# Patient Record
Sex: Male | Born: 1965 | Race: Black or African American | Hispanic: No | Marital: Single | Smoking: Never smoker
Health system: Southern US, Community
[De-identification: ages and names within clinical notes are randomized; demographics above are authoritative.]

## PROBLEM LIST (undated history)

## (undated) DIAGNOSIS — F329 Major depressive disorder, single episode, unspecified: Secondary | ICD-10-CM

## (undated) DIAGNOSIS — I10 Essential (primary) hypertension: Secondary | ICD-10-CM

## (undated) DIAGNOSIS — N390 Urinary tract infection, site not specified: Secondary | ICD-10-CM

## (undated) DIAGNOSIS — F419 Anxiety disorder, unspecified: Secondary | ICD-10-CM

## (undated) DIAGNOSIS — G8929 Other chronic pain: Secondary | ICD-10-CM

## (undated) DIAGNOSIS — M549 Dorsalgia, unspecified: Secondary | ICD-10-CM

## (undated) DIAGNOSIS — F32A Depression, unspecified: Secondary | ICD-10-CM

---

## 1999-10-04 ENCOUNTER — Emergency Department (HOSPITAL_COMMUNITY): Admission: EM | Admit: 1999-10-04 | Discharge: 1999-10-05 | Payer: Self-pay | Admitting: Emergency Medicine

## 1999-10-05 ENCOUNTER — Encounter: Payer: Self-pay | Admitting: Emergency Medicine

## 2001-02-12 ENCOUNTER — Encounter: Admission: RE | Admit: 2001-02-12 | Discharge: 2001-02-12 | Payer: Self-pay | Admitting: General Practice

## 2001-02-12 ENCOUNTER — Encounter: Payer: Self-pay | Admitting: General Practice

## 2001-05-02 ENCOUNTER — Ambulatory Visit (HOSPITAL_COMMUNITY): Admission: RE | Admit: 2001-05-02 | Discharge: 2001-05-02 | Payer: Self-pay | Admitting: Orthopedic Surgery

## 2001-08-11 ENCOUNTER — Encounter: Payer: Self-pay | Admitting: Physical Medicine and Rehabilitation

## 2001-08-11 ENCOUNTER — Ambulatory Visit (HOSPITAL_COMMUNITY)
Admission: RE | Admit: 2001-08-11 | Discharge: 2001-08-11 | Payer: Self-pay | Admitting: Physical Medicine and Rehabilitation

## 2004-07-08 ENCOUNTER — Emergency Department (HOSPITAL_COMMUNITY): Admission: EM | Admit: 2004-07-08 | Discharge: 2004-07-08 | Payer: Self-pay | Admitting: Emergency Medicine

## 2004-11-15 ENCOUNTER — Emergency Department (HOSPITAL_COMMUNITY): Admission: EM | Admit: 2004-11-15 | Discharge: 2004-11-15 | Payer: Self-pay | Admitting: Emergency Medicine

## 2004-11-28 ENCOUNTER — Ambulatory Visit: Payer: Self-pay | Admitting: Gastroenterology

## 2004-12-06 ENCOUNTER — Encounter (INDEPENDENT_AMBULATORY_CARE_PROVIDER_SITE_OTHER): Payer: Self-pay | Admitting: *Deleted

## 2004-12-06 ENCOUNTER — Ambulatory Visit: Payer: Self-pay | Admitting: Gastroenterology

## 2005-01-09 ENCOUNTER — Ambulatory Visit: Payer: Self-pay | Admitting: Gastroenterology

## 2005-06-25 ENCOUNTER — Emergency Department (HOSPITAL_COMMUNITY): Admission: EM | Admit: 2005-06-25 | Discharge: 2005-06-25 | Payer: Self-pay | Admitting: *Deleted

## 2006-11-09 ENCOUNTER — Emergency Department (HOSPITAL_COMMUNITY): Admission: EM | Admit: 2006-11-09 | Discharge: 2006-11-09 | Payer: Self-pay | Admitting: Emergency Medicine

## 2007-04-19 ENCOUNTER — Emergency Department (HOSPITAL_COMMUNITY): Admission: EM | Admit: 2007-04-19 | Discharge: 2007-04-19 | Payer: Self-pay | Admitting: Emergency Medicine

## 2009-06-15 ENCOUNTER — Telehealth (INDEPENDENT_AMBULATORY_CARE_PROVIDER_SITE_OTHER): Payer: Self-pay | Admitting: *Deleted

## 2009-10-06 ENCOUNTER — Ambulatory Visit: Payer: Self-pay | Admitting: Internal Medicine

## 2009-10-06 ENCOUNTER — Encounter (INDEPENDENT_AMBULATORY_CARE_PROVIDER_SITE_OTHER): Payer: Self-pay | Admitting: *Deleted

## 2009-10-06 LAB — CONVERTED CEMR LAB
Alkaline Phosphatase: 63 units/L (ref 39–117)
BUN: 11 mg/dL (ref 6–23)
Cholesterol: 228 mg/dL — ABNORMAL HIGH (ref 0–200)
Creatinine, Ser: 1.41 mg/dL (ref 0.40–1.50)
Glucose, Bld: 131 mg/dL — ABNORMAL HIGH (ref 70–99)
HDL: 53 mg/dL (ref >39)
LDL Cholesterol: 148 mg/dL — ABNORMAL HIGH (ref 0–99)
Sodium: 142 meq/L (ref 135–145)
Total Bilirubin: 0.5 mg/dL (ref 0.3–1.2)
Total CHOL/HDL Ratio: 4.3
Triglycerides: 135 mg/dL (ref <150)
VLDL: 27 mg/dL (ref 0–40)

## 2009-12-01 ENCOUNTER — Encounter (INDEPENDENT_AMBULATORY_CARE_PROVIDER_SITE_OTHER): Payer: Self-pay | Admitting: Internal Medicine

## 2009-12-01 LAB — CONVERTED CEMR LAB
Basophils Absolute: 0 10*3/uL (ref 0.0–0.1)
Basophils Relative: 0 % (ref 0–1)
Lymphocytes Relative: 39 % (ref 12–46)
MCHC: 33.6 g/dL (ref 30.0–36.0)
Neutro Abs: 3.1 10*3/uL (ref 1.7–7.7)
Platelets: 323 10*3/uL (ref 150–400)
RDW: 12 % (ref 11.5–15.5)

## 2009-12-13 ENCOUNTER — Encounter
Admission: RE | Admit: 2009-12-13 | Discharge: 2009-12-28 | Payer: Self-pay | Source: Home / Self Care | Attending: Family Medicine | Admitting: Family Medicine

## 2010-01-01 ENCOUNTER — Encounter: Admission: RE | Admit: 2010-01-01 | Payer: Self-pay | Source: Home / Self Care | Admitting: Family Medicine

## 2010-01-03 ENCOUNTER — Encounter
Admission: RE | Admit: 2010-01-03 | Discharge: 2010-01-31 | Payer: Self-pay | Source: Home / Self Care | Attending: Family Medicine | Admitting: Family Medicine

## 2010-01-22 ENCOUNTER — Encounter: Payer: Self-pay | Admitting: Neurosurgery

## 2010-02-02 NOTE — Progress Notes (Signed)
  Phone Note Other Incoming   Request: Send information Summary of Call: Request for records received from [facility]. Request forwarded to [facility].     

## 2010-09-20 ENCOUNTER — Encounter: Payer: Self-pay | Admitting: Family Medicine

## 2010-09-20 ENCOUNTER — Emergency Department (HOSPITAL_BASED_OUTPATIENT_CLINIC_OR_DEPARTMENT_OTHER)
Admission: EM | Admit: 2010-09-20 | Discharge: 2010-09-20 | Disposition: A | Discharge status: Home / Self Care | Payer: Self-pay | Attending: Emergency Medicine | Admitting: Emergency Medicine

## 2010-09-20 DIAGNOSIS — F341 Dysthymic disorder: Secondary | ICD-10-CM | POA: Insufficient documentation

## 2010-09-20 DIAGNOSIS — M549 Dorsalgia, unspecified: Secondary | ICD-10-CM | POA: Insufficient documentation

## 2010-09-20 DIAGNOSIS — G8929 Other chronic pain: Secondary | ICD-10-CM | POA: Insufficient documentation

## 2010-09-20 DIAGNOSIS — I10 Essential (primary) hypertension: Secondary | ICD-10-CM | POA: Insufficient documentation

## 2010-09-20 HISTORY — DX: Anxiety disorder, unspecified: F41.9

## 2010-09-20 HISTORY — DX: Major depressive disorder, single episode, unspecified: F32.9

## 2010-09-20 HISTORY — DX: Other chronic pain: G89.29

## 2010-09-20 HISTORY — DX: Dorsalgia, unspecified: M54.9

## 2010-09-20 HISTORY — DX: Depression, unspecified: F32.A

## 2010-09-20 HISTORY — DX: Essential (primary) hypertension: I10

## 2010-09-20 MED ORDER — HYDROCODONE-ACETAMINOPHEN 5-500 MG PO TABS: 1.0000 | ORAL_TABLET | Freq: Four times a day (QID) | ORAL | Status: AC | PRN
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
## 2010-09-24 NOTE — ED Provider Notes (Signed)
Medical screening examination/treatment/procedure(s) were performed by non-physician practitioner and as supervising physician I was immediately available for consultation/collaboration.  Toy Baker, MD 09/24/10 [phone]

## 2010-09-26 LAB — POCT CARDIAC MARKERS
Myoglobin, poc: 120
Operator id: 2725
Troponin i, poc: <0.05

## 2010-09-26 LAB — CBC
HCT: 43.8
MCV: 89.5
Platelets: 349
RBC: 4.89
WBC: 11 — ABNORMAL HIGH

## 2010-09-26 LAB — BASIC METABOLIC PANEL
BUN: 12
Chloride: 100
Creatinine, Ser: 1.6 — ABNORMAL HIGH
GFR calc Af Amer: 57 — ABNORMAL LOW
GFR calc non Af Amer: 47 — ABNORMAL LOW
Potassium: 4

## 2010-09-26 LAB — DIFFERENTIAL
Eosinophils Absolute: 0
Lymphocytes Relative: 14
Lymphs Abs: 1.5
Monocytes Relative: 3
Neutrophils Relative %: 82 — ABNORMAL HIGH

## 2012-03-03 ENCOUNTER — Other Ambulatory Visit: Payer: Self-pay | Admitting: Neurosurgery

## 2012-03-03 DIAGNOSIS — M47817 Spondylosis without myelopathy or radiculopathy, lumbosacral region: Secondary | ICD-10-CM

## 2012-03-06 ENCOUNTER — Ambulatory Visit
Admission: RE | Admit: 2012-03-06 | Discharge: 2012-03-06 | Disposition: A | Discharge status: Home / Self Care | Payer: Self-pay | Source: Ambulatory Visit | Attending: Neurosurgery | Admitting: Neurosurgery

## 2012-03-06 DIAGNOSIS — M47817 Spondylosis without myelopathy or radiculopathy, lumbosacral region: Secondary | ICD-10-CM

## 2012-03-21 ENCOUNTER — Other Ambulatory Visit: Payer: Self-pay | Admitting: Neurosurgery

## 2012-03-21 DIAGNOSIS — M47816 Spondylosis without myelopathy or radiculopathy, lumbar region: Secondary | ICD-10-CM

## 2012-03-25 ENCOUNTER — Other Ambulatory Visit: Payer: Self-pay

## 2012-03-27 ENCOUNTER — Other Ambulatory Visit: Payer: Self-pay

## 2012-04-01 ENCOUNTER — Ambulatory Visit
Admission: RE | Admit: 2012-04-01 | Discharge: 2012-04-01 | Disposition: A | Discharge status: Home / Self Care | Payer: No Typology Code available for payment source | Source: Ambulatory Visit | Attending: Neurosurgery | Admitting: Neurosurgery

## 2012-04-01 DIAGNOSIS — M47816 Spondylosis without myelopathy or radiculopathy, lumbar region: Secondary | ICD-10-CM

## 2012-04-01 MED ORDER — METHYLPREDNISOLONE ACETATE 40 MG/ML INJ SUSP (RADIOLOG
120.0000 mg | Freq: Once | INTRAMUSCULAR | Status: AC
  Administered 2012-04-01: 120 mg via INTRA_ARTICULAR

## 2012-04-01 MED ORDER — IOHEXOL 180 MG/ML  SOLN
2.0000 mL | Freq: Once | INTRAMUSCULAR | Status: AC | PRN
  Administered 2012-04-01: 2 mL via INTRA_ARTICULAR

## 2013-08-19 ENCOUNTER — Encounter (HOSPITAL_BASED_OUTPATIENT_CLINIC_OR_DEPARTMENT_OTHER): Payer: Self-pay | Admitting: Emergency Medicine

## 2013-08-19 ENCOUNTER — Emergency Department (HOSPITAL_BASED_OUTPATIENT_CLINIC_OR_DEPARTMENT_OTHER)
Admission: EM | Admit: 2013-08-19 | Discharge: 2013-08-19 | Disposition: A | Discharge status: Home / Self Care | Payer: Medicare Other | Attending: Emergency Medicine | Admitting: Emergency Medicine

## 2013-08-19 ENCOUNTER — Emergency Department (HOSPITAL_BASED_OUTPATIENT_CLINIC_OR_DEPARTMENT_OTHER): Payer: Medicare Other

## 2013-08-19 DIAGNOSIS — N139 Obstructive and reflux uropathy, unspecified: Secondary | ICD-10-CM | POA: Insufficient documentation

## 2013-08-19 DIAGNOSIS — F411 Generalized anxiety disorder: Secondary | ICD-10-CM | POA: Diagnosis not present

## 2013-08-19 DIAGNOSIS — N400 Enlarged prostate without lower urinary tract symptoms: Secondary | ICD-10-CM | POA: Diagnosis not present

## 2013-08-19 DIAGNOSIS — Z87440 Personal history of urinary (tract) infections: Secondary | ICD-10-CM | POA: Diagnosis not present

## 2013-08-19 DIAGNOSIS — I10 Essential (primary) hypertension: Secondary | ICD-10-CM | POA: Diagnosis not present

## 2013-08-19 DIAGNOSIS — Z79899 Other long term (current) drug therapy: Secondary | ICD-10-CM | POA: Diagnosis not present

## 2013-08-19 DIAGNOSIS — G8929 Other chronic pain: Secondary | ICD-10-CM | POA: Insufficient documentation

## 2013-08-19 DIAGNOSIS — N200 Calculus of kidney: Secondary | ICD-10-CM | POA: Diagnosis not present

## 2013-08-19 DIAGNOSIS — F3289 Other specified depressive episodes: Secondary | ICD-10-CM | POA: Insufficient documentation

## 2013-08-19 DIAGNOSIS — Z791 Long term (current) use of non-steroidal anti-inflammatories (NSAID): Secondary | ICD-10-CM | POA: Insufficient documentation

## 2013-08-19 DIAGNOSIS — F329 Major depressive disorder, single episode, unspecified: Secondary | ICD-10-CM | POA: Insufficient documentation

## 2013-08-19 DIAGNOSIS — R339 Retention of urine, unspecified: Secondary | ICD-10-CM | POA: Insufficient documentation

## 2013-08-19 DIAGNOSIS — R338 Other retention of urine: Secondary | ICD-10-CM

## 2013-08-19 HISTORY — DX: Urinary tract infection, site not specified: N39.0

## 2013-08-19 LAB — URINE MICROSCOPIC-ADD ON

## 2013-08-19 LAB — URINALYSIS, ROUTINE W REFLEX MICROSCOPIC
BILIRUBIN URINE: NEGATIVE
Glucose, UA: NEGATIVE mg/dL
Ketones, ur: NEGATIVE mg/dL
Leukocytes, UA: NEGATIVE
Nitrite: NEGATIVE
PH: 5.5 (ref 5.0–8.0)
Protein, ur: NEGATIVE mg/dL
SPECIFIC GRAVITY, URINE: 1.022 (ref 1.005–1.030)
Urobilinogen, UA: 0.2 mg/dL (ref 0.0–1.0)

## 2013-08-19 MED ORDER — TAMSULOSIN HCL 0.4 MG PO CAPS: 0.4000 mg | ORAL_CAPSULE | Freq: Every day | ORAL | Status: DC

## 2013-08-19 MED ORDER — HYDROCODONE-ACETAMINOPHEN 5-325 MG PO TABS: 1.0000 | ORAL_TABLET | ORAL | Status: DC | PRN

## 2013-08-19 NOTE — Discharge Instructions (Signed)
You were evaluated for a repeat episode of "Acute Urinary Retention" Urine study showed no infection, but some microscopic blood consistent with possible Kidney Stone Rectal Exam was consistent with mildly enlarged smooth symmetric prostate, which may be possible "BPH or Enlarged Prostate" CT Abd/Pelvis scan showed "2 mm stone in the distal right ureter causing partial obstruction of the right collecting system. Urinary bladder wall is thickened, mildly enlarged prostate" This is most likely the cause of your pain.  Please call Dr. Vevelyn RoyalsBorden's Urology office, explain why you were seen in the ER and that you had a "Kidney stone with some partial obstruction, and acute urinary retention" they will schedule you a close follow-up appointment.  Also call your primary doctor to follow-up with these issues.  Prescribed Flomax take 1 tablet daily until you follow-up with Urology (please discuss this medicine with them, and they may have you continue or discontinue it) Take Norco as needed for pain.  If your symptoms get significantly worse, develop persistent urinary retention, abdominal pain, burning when you pee, blood in urine, fevers/chills, flank pain, please call your doctor for evaluation, otherwise return to Emergency Department.   Kidney Stones Kidney stones (urolithiasis) are deposits that form inside your kidneys. The intense pain is caused by the stone moving through the urinary tract. When the stone moves, the ureter goes into spasm around the stone. The stone is usually passed in the urine.  CAUSES   A disorder that makes certain neck glands produce too much parathyroid hormone (primary hyperparathyroidism).  A buildup of uric acid crystals, similar to gout in your joints.  Narrowing (stricture) of the ureter.  A kidney obstruction present at birth (congenital obstruction).  Previous surgery on the kidney or ureters.  Numerous kidney infections. SYMPTOMS   Feeling sick to your  stomach (nauseous).  Throwing up (vomiting).  Blood in the urine (hematuria).  Pain that usually spreads (radiates) to the groin.  Frequency or urgency of urination. DIAGNOSIS   Taking a history and physical exam.  Blood or urine tests.  CT scan.  Occasionally, an examination of the inside of the urinary bladder (cystoscopy) is performed. TREATMENT   Observation.  Increasing your fluid intake.  Extracorporeal shock wave lithotripsy--This is a noninvasive procedure that uses shock waves to break up kidney stones.  Surgery may be needed if you have severe pain or persistent obstruction. There are various surgical procedures. Most of the procedures are performed with the use of small instruments. Only small incisions are needed to accommodate these instruments, so recovery time is minimized. The size, location, and chemical composition are all important variables that will determine the proper choice of action for you. Talk to your health care provider to better understand your situation so that you will minimize the risk of injury to yourself and your kidney.  HOME CARE INSTRUCTIONS   Drink enough water and fluids to keep your urine clear or pale yellow. This will help you to pass the stone or stone fragments.  Strain all urine through the provided strainer. Keep all particulate matter and stones for your health care provider to see. The stone causing the pain may be as small as a grain of salt. It is very important to use the strainer each and every time you pass your urine. The collection of your stone will allow your health care provider to analyze it and verify that a stone has actually passed. The stone analysis will often identify what you can do to reduce the incidence  of recurrences.  Only take over-the-counter or prescription medicines for pain, discomfort, or fever as directed by your health care provider.  Make a follow-up appointment with your health care provider as  directed.  Get follow-up X-rays if required. The absence of pain does not always mean that the stone has passed. It may have only stopped moving. If the urine remains completely obstructed, it can cause loss of kidney function or even complete destruction of the kidney. It is your responsibility to make sure X-rays and follow-ups are completed. Ultrasounds of the kidney can show blockages and the status of the kidney. Ultrasounds are not associated with any radiation and can be performed easily in a matter of minutes. SEEK MEDICAL CARE IF:  You experience pain that is progressive and unresponsive to any pain medicine you have been prescribed. SEEK IMMEDIATE MEDICAL CARE IF:   Pain cannot be controlled with the prescribed medicine.  You have a fever or shaking chills.  The severity or intensity of pain increases over 18 hours and is not relieved by pain medicine.  You develop a new onset of abdominal pain.  You feel faint or pass out.  You are unable to urinate. MAKE SURE YOU:   Understand these instructions.  Will watch your condition.  Will get help right away if you are not doing well or get worse. Document Released: 12/18/2004 Document Revised: 08/20/2012 Document Reviewed: 05/21/2012 [facility] Patient Information 2015 [place], [place]. This information is not intended to replace advice given to you by your health care provider. Make sure you discuss any questions you have with your health care provider.     Acute Urinary Retention Acute urinary retention is when you are unable to pee (urinate). Acute urinary retention is common in older men. Prostates can get bigger, which blocks the flow of pee.  HOME CARE  Drink enough fluids to keep your pee clear or pale yellow.  If you are sent home with a tube that drains the bladder (catheter), there will be a drainage bag attached to it. There are two types of bags. One is big that you can wear at night without having to empty it. One  is smaller and needs to be emptied more often.  Keep the drainage bag empty.  Keep the drainage bag lower than your catheter.  Only take medicine as told by your doctor. GET HELP IF:  You have a low-grade fever.  You have spasms or you are leaking pee when you have spasms. GET HELP RIGHT AWAY IF:   You have chills or a fever.  Your catheter stops draining pee.  Your catheter falls out.  You have increased bleeding that does not stop after you have rested and increased the amount of fluids you had been drinking. MAKE SURE YOU:   Understand these instructions.  Will watch your condition.  Will get help right away if you are not doing well or get worse. Document Released: 06/06/2007 Document Revised: 10/08/2012 Document Reviewed: 05/29/2012 [facility] Patient Information 2015 [place], [place]. This information is not intended to replace advice given to you by your health care provider. Make sure you discuss any questions you have with your health care provider.

## 2013-08-19 NOTE — ED Provider Notes (Signed)
CSN: [id number]     Arrival date & time 08/19/13  4098 History   First MD Initiated Contact with Patient 08/19/13 0751   History provided by patient.   Chief Complaint  Patient presents with  . Urinary Retention   HPI  Patient complaints of urinary retention, started this morning around 0530. He describes feeling "bladder fullness, like he had to pee" but only able to urinate a very small amount, associated with sharp intermittent right inguinal and right low back pain (lasting about 1 min and occurred every few minutes), additional associated symptoms with some sweating, nausea (w/o vomiting), and episode of diarrhea. Denies any urinary retention, dysuria, or pain yesterday (was voiding normally). Significant recent history with similar episode about 1.5 months ago, went to Urgent Care stated he was "diagnosed with a urinary infection" and given Flomax (7 tablets), Vicodin, Phenergan (pt has empty pill bottles today), which resolved his urinary retention at that time, and he has been urinating normally for the past month. Additionally, denies fevers/chills, dysuria, hematuria, penile discharge, abdominal pain, new numbness / tingling, weakness, saddle anesthesia, urinary/fecal incontinence.  Significant PMH with chronic low back pain with Lumbar osteoarthritis, known L5-S1 foraminal stenosis and facet arthropathy, with multiple prior imaging studies, last MRI 2014. Additional history, patient denies hx kidney stones, states never sexually active, no hx STIs, no history of BPH.   Past Medical History  Diagnosis Date  . Hypertension   . Anxiety   . Depression   . Back pain, chronic   . UTI (lower urinary tract infection)    History reviewed. No pertinent past surgical history. No family history on file. History  Substance Use Topics  . Smoking status: Never Smoker   . Smokeless tobacco: Not on file  . Alcohol Use: No    Review of Systems  See above HPI  Allergies  Beeswax and  Benadryl  Home Medications   Prior to Admission medications   Medication Sig Start Date End Date Taking? Authorizing Provider  HYDROCODONE-ACETAMINOPHEN PO Take by mouth.   Yes Historical Provider, MD  cyclobenzaprine (FLEXERIL) 10 MG tablet Take 10 mg by mouth 3 (three) times daily as needed.      Historical Provider, MD  DULoxetine (CYMBALTA) 60 MG capsule Take 60 mg by mouth daily.      Historical Provider, MD  HYDROcodone-acetaminophen (NORCO) 5-325 MG per tablet Take 1-2 tablets by mouth every 4 (four) hours as needed. 08/19/13   Saralyn Pilar, DO  lisinopril-hydrochlorothiazide (PRINZIDE,ZESTORETIC) 20-25 MG per tablet Take 1 tablet by mouth daily.      Historical Provider, MD  meloxicam (MOBIC) 15 MG tablet Take 15 mg by mouth daily.      Historical Provider, MD  tamsulosin (FLOMAX) 0.4 MG CAPS capsule Take 1 capsule (0.4 mg total) by mouth daily. 08/19/13   Saralyn Pilar, DO  traMADol (ULTRAM) 50 MG tablet Take 50 mg by mouth every 6 (six) hours as needed.      Historical Provider, MD   BP 140/86  Pulse 75  Temp(Src) 97.9 F (36.6 C) (Oral)  Resp 16  SpO2 98% Physical Exam  Gen - well-appearing, pleasant, walks with cane, NAD HEENT - PERRL, EOMI, oropharynx clear, MMM Neck - supple Heart - RRR, no murmurs heard Abd - obese, soft, abdomen non-tender and non-distended, with +tenderness Right inguinal region, no masses, +active BS MSK - Back non-tender over spinous processes, Right lower lumbar / SI paraspinal muscle tenderness without hypertonicity Ext - non-tender, no edema, peripheral pulses  intact +2 b/l Skin - warm, dry, no rashes Neuro - awake, alert, oriented, grossly non-focal, intact muscle strength 5/5 b/l, intact distal sensation to light touch, gait normal GU - normal appearing male genitalia, no discharge or rash, testicles non-tender, no inguinal hernia Rectal - mild smooth symmetrically enlarged prostate without firmness or nodularity, no gross blood  on exam, no hemorrhoids  ED Course  Procedures (including critical care time) Labs Review Labs Reviewed  URINALYSIS, ROUTINE W REFLEX MICROSCOPIC - Abnormal; Notable for the following:    Hgb urine dipstick SMALL (*)    All other components within normal limits  URINE MICROSCOPIC-ADD ON - Abnormal; Notable for the following:    Bacteria, UA FEW (*)    All other components within normal limits  URINE CULTURE    Imaging Review Ct Abdomen Pelvis Wo Contrast  08/19/2013   CLINICAL DATA:  Urinary retention.  Right groin pain  EXAM: CT ABDOMEN AND PELVIS WITHOUT CONTRAST  TECHNIQUE: Multidetector CT imaging of the abdomen and pelvis was performed following the standard protocol without IV contrast.  COMPARISON:  None.  FINDINGS: Mild obstruction of the right kidney with mild right hydronephrosis and hydroureter. 2 mm stone in the distal right ureter at the UVJ causing partial obstruction of the right kidney. Left kidney shows no obstruction. No additional renal calculi. Bladder wall is thickened.  Lung bases are clear. Liver and gallbladder are normal. Pancreas and spleen are normal.  Negative for mass or adenopathy. The bowel appears normal. Normal appendix. Prostate mildly enlarged. No acute bony abnormality.  IMPRESSION: 2 mm stone in the distal right ureter causing partial obstruction of the right collecting system. Urinary bladder wall is thickened.   Electronically Signed   By: Marlan Palauharles  Clark M.D.   On: 08/19/2013 09:11     EKG Interpretation None      MDM   Final diagnoses:  Acute urinary retention  Prostate enlargement  Kidney stone on right side  Partial obstruction of the urinary tract   48 yr M with PMH hx chronic back pain, lumbar OA with L5-S1 foraminal stenosis without hx compression, presents for acute urinary retention since 0530, symptoms with fullness, sharp intermittent R-inguinal / LBP with minimal UOP today (yesterday urinating normal), current symptoms similar to  recent episode 1.5 months ago ([facility] resolution). No known hx BPH, nephrolithiasis. No red flags or new weakness, numbness/tingling, saddle anesthesia, urinary/fecal incontinence.  Proceed with attempted void, will collect UA, and perform post-void bladder scan to see if retaining. Anticipate patient may need in and out foley if acute retention, will offer rectal exam to eval prostate. Consider discharge on Flomax, and referral to Urology.  UPDATE @ 0855 - UA with neg nitrite, neg leuks, WBC 0-2 not suggestive of infection, significant with small Hgb and RBC 7-10, concerning for possible nephrolithiasis to explain symptoms. Rectal exam as above. Bladder Scan post-void residual with 40-54 cc remaining, consistent with appropriate voiding. Will order Abd/Pelvis CT to evaluate for nephrolithiasis, hydro.  UPDATE @ 0930 - CT Abd/Pelvis with 2 mm stone in the distal right ureter causing partial obstruction of the right collecting system. Urinary bladder wall is thickened, and noted mildly enlarged prostate. Discussed with patient.  Discharge to home with rx for Flomax (may help pass distal stone, and in setting of urinary retention with mild enlarged prostate), rx Norco for pain, may take NSAIDs/Tylenol PRN, pending Urine Culture (will f/u if positive), advised close follow-up with Urology (refer to Dr.  Borden) for BPH and nephrolithiasis with acute recurrent urinary retention, also f/u with PCP. Return precautions given.   Saralyn Pilar, DO 08/19/13 1317

## 2013-08-19 NOTE — ED Notes (Signed)
Patient preparing for discharge. 

## 2013-08-19 NOTE — ED Notes (Signed)
Pt c/o urinary frequency, urgency and retention since last night. Pt also reports R groin pain radiating to right flank. Pt sts he is able to urinate a small amount. Denies frank blood in urine. Reports h/o UTI but denies kidney stones. Similar episode in July and was evaluated at an urgent care at that time.

## 2013-08-20 LAB — URINE CULTURE
Colony Count: NO GROWTH
Culture: NO GROWTH

## 2013-08-23 NOTE — ED Provider Notes (Signed)
I saw and evaluated the patient, reviewed the resident's note and I agree with the findings and plan.   .Face to face Exam:  General:  Awake HEENT:  Atraumatic Resp:  Normal effort Abd:  Nondistended Neuro:No focal weakness Lymph: No adenopathy   Nelia Shi, MD 08/23/13 2142

## 2015-05-21 IMAGING — CT CT ABD-PELV W/O CM
2 of 4 series · 17 of 46 positions shown, 19 images · non-contrast
Comparison: None.

CLINICAL DATA: Urinary retention.  Right groin pain

EXAM:
CT ABDOMEN AND PELVIS WITHOUT CONTRAST
TECHNIQUE: Multidetector CT imaging of the abdomen and pelvis was performed
following the standard protocol without IV contrast.

[Series 2: renal stone > 200 lbs 5.0 b31f · axial · 0.71mm/px · z∈[+858,+1298]mm · 14 of 96 slices shown, 16 images]
[im 4/96  soft-tissue]
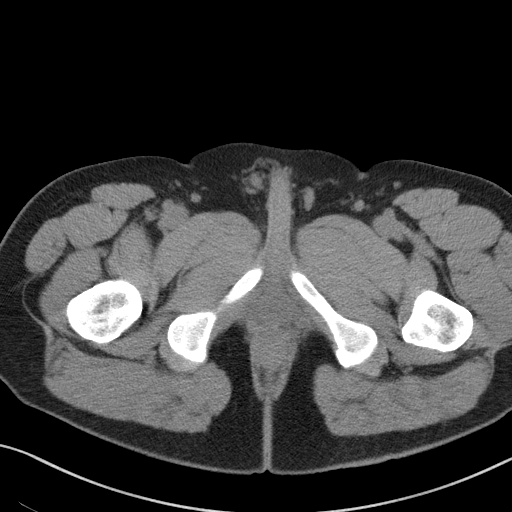
[im 4/96  bone]
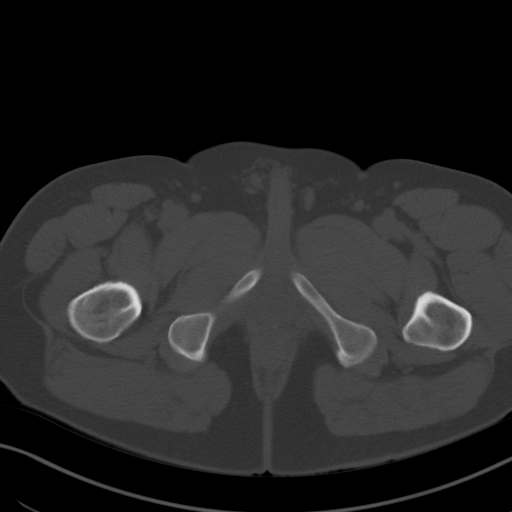
[im 12/96  soft-tissue]
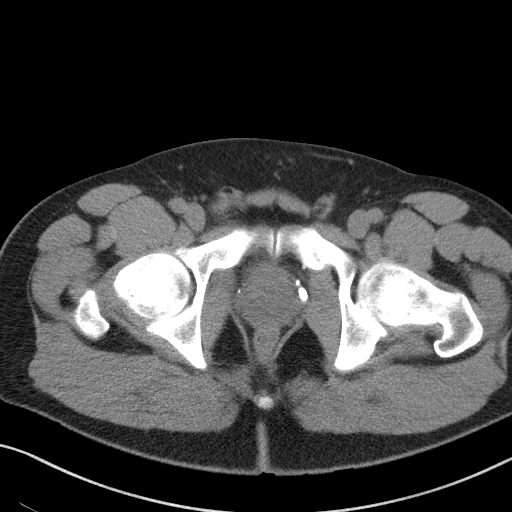
[im 20/96  soft-tissue]
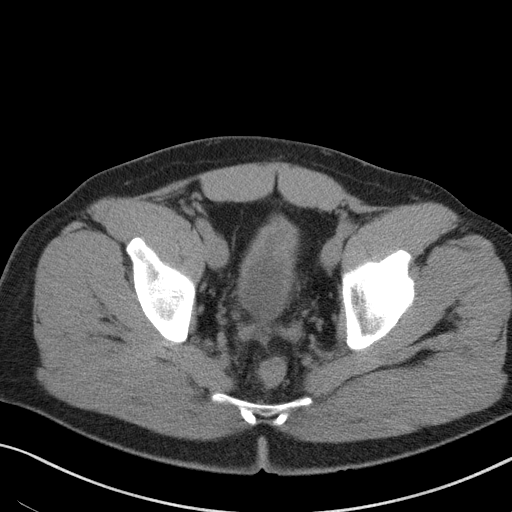
[im 24/96  soft-tissue]
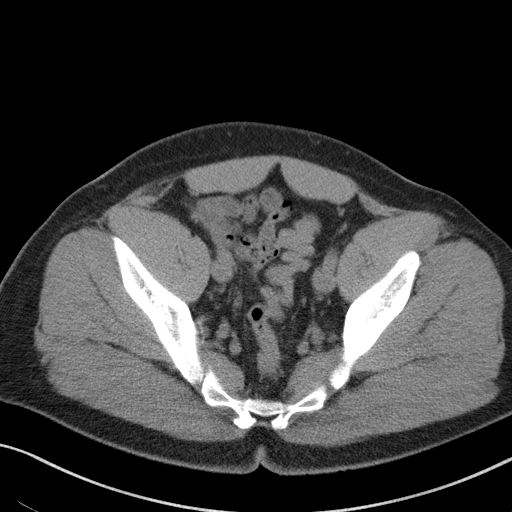
[im 32/96  soft-tissue]
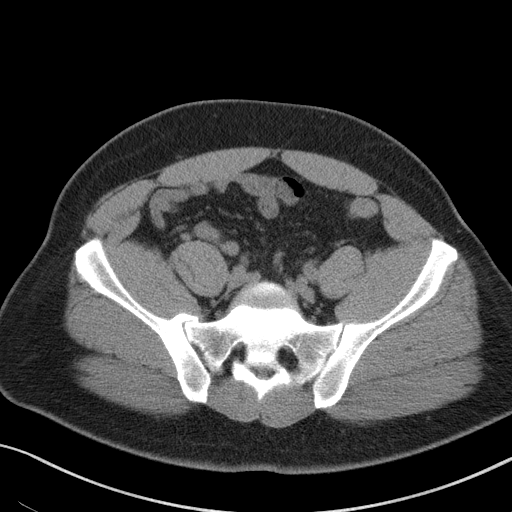
[im 40/96  soft-tissue]
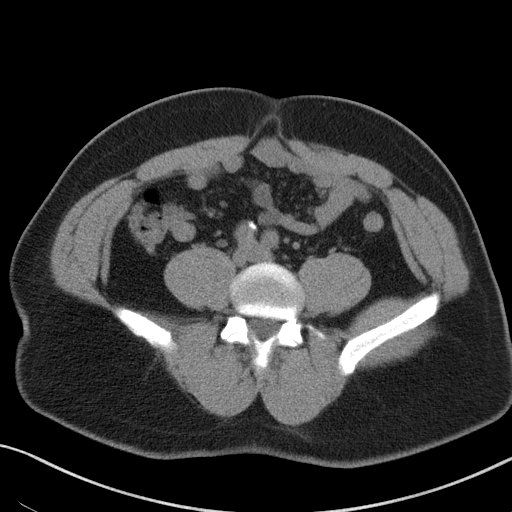
[im 44/96  soft-tissue]
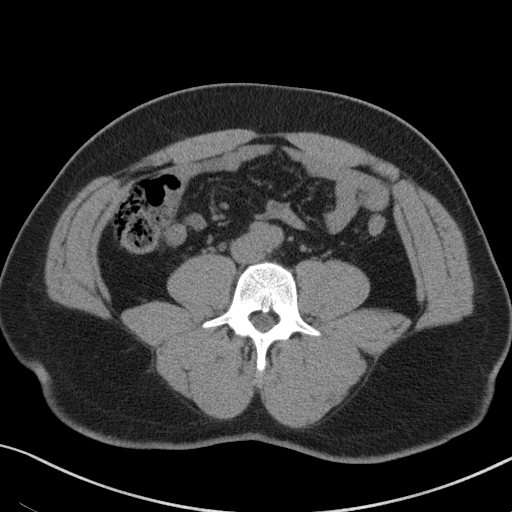
[im 52/96  soft-tissue]
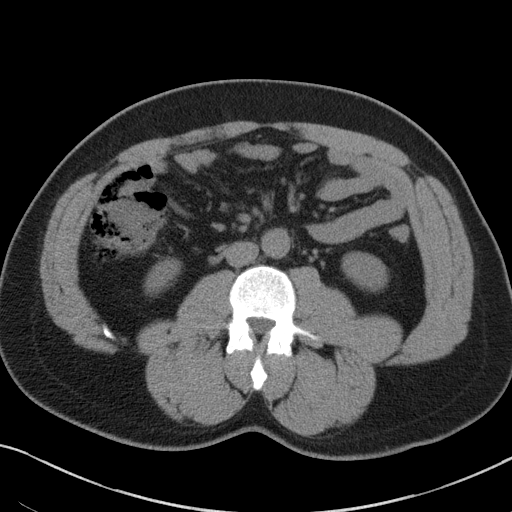
[im 56/96  soft-tissue]
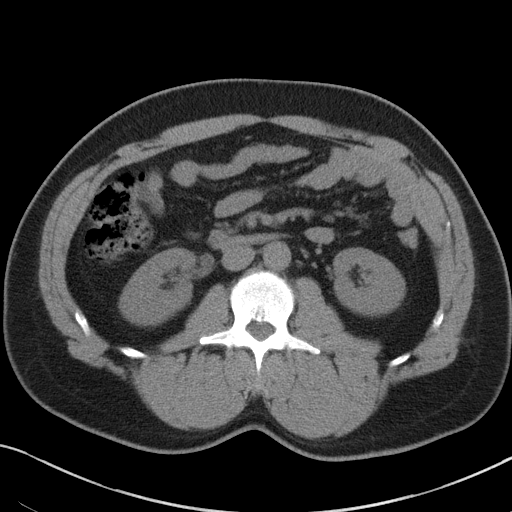
[im 56/96  bone]
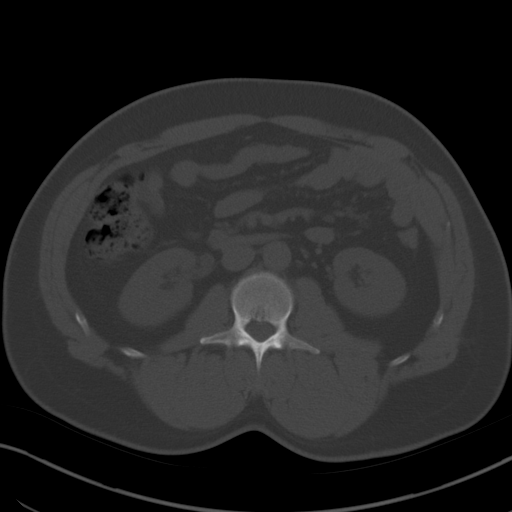
[im 64/96  soft-tissue]
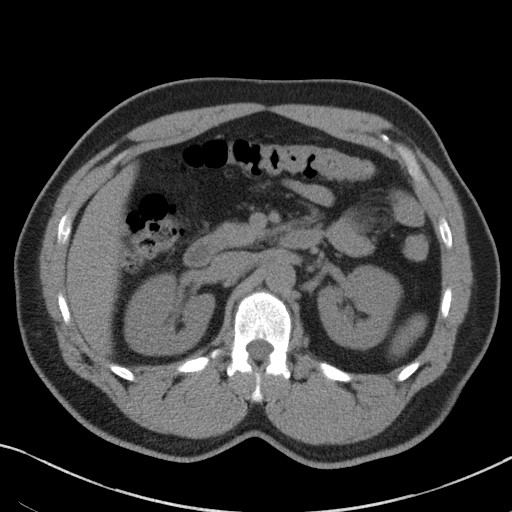
[im 72/96  soft-tissue]
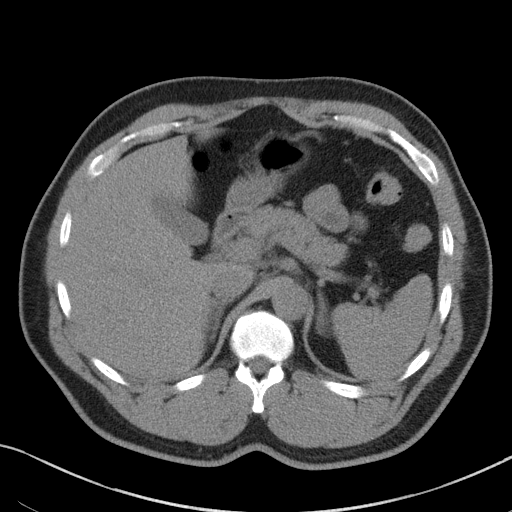
[im 76/96  soft-tissue]
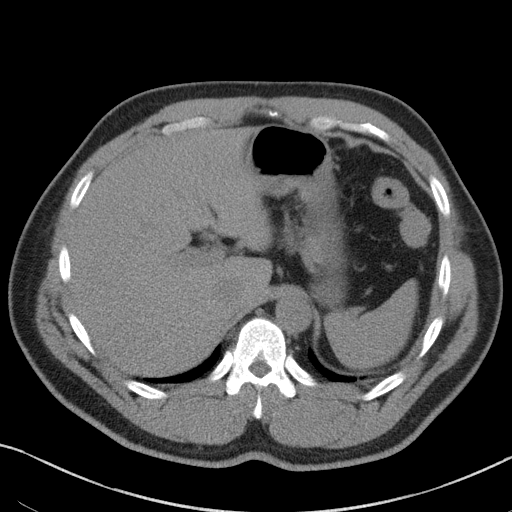
[im 84/96  soft-tissue]
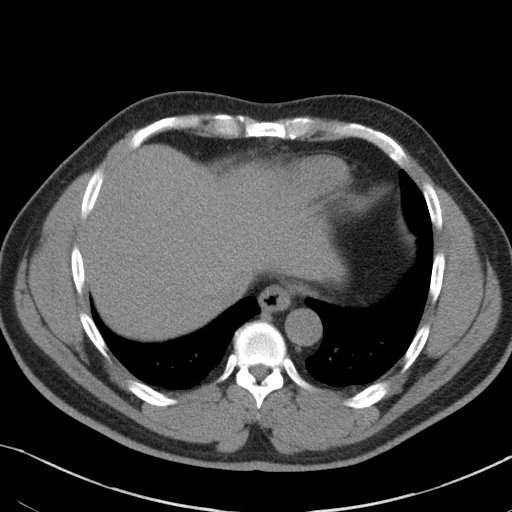
[im 92/96  soft-tissue]
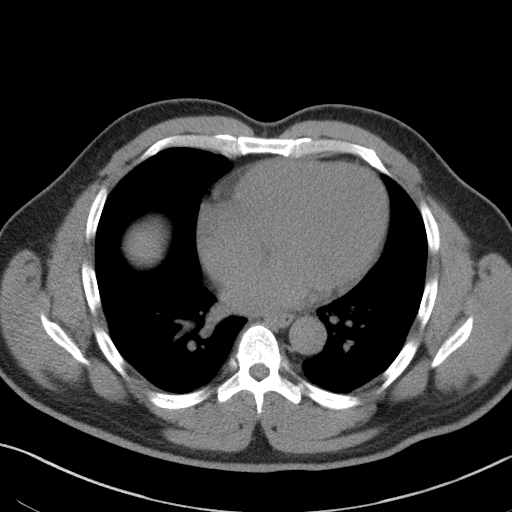

[Series 5: renal stone 3.0 coronal · coronal · 0.73mm/px · 3 of 94 slices shown]
[im 32/94  soft-tissue]
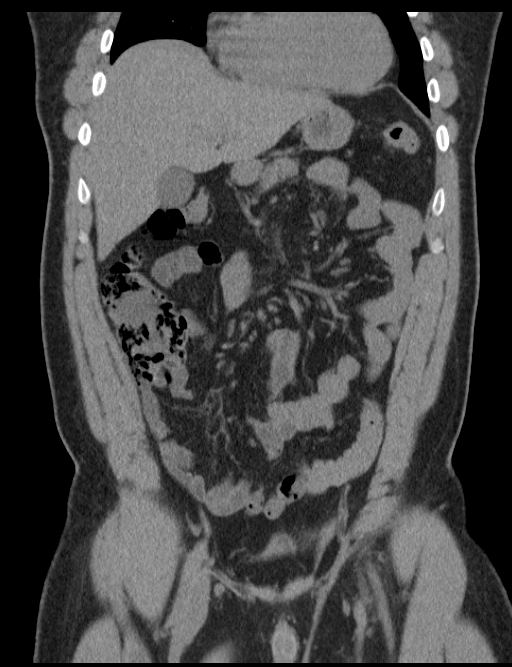
[im 42/94  soft-tissue]
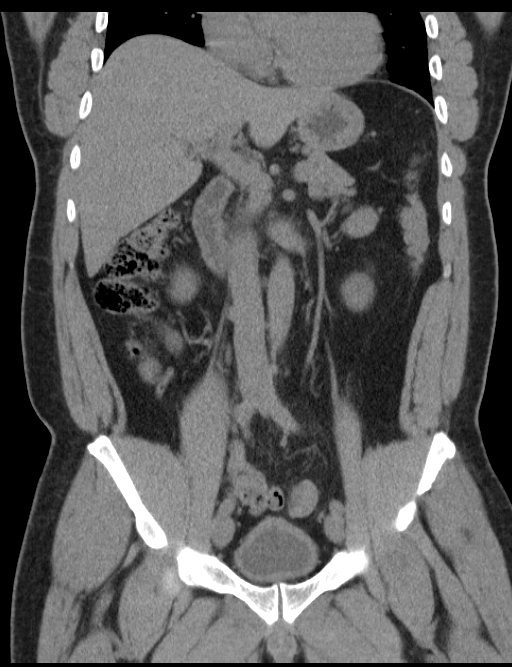
[im 52/94  soft-tissue]
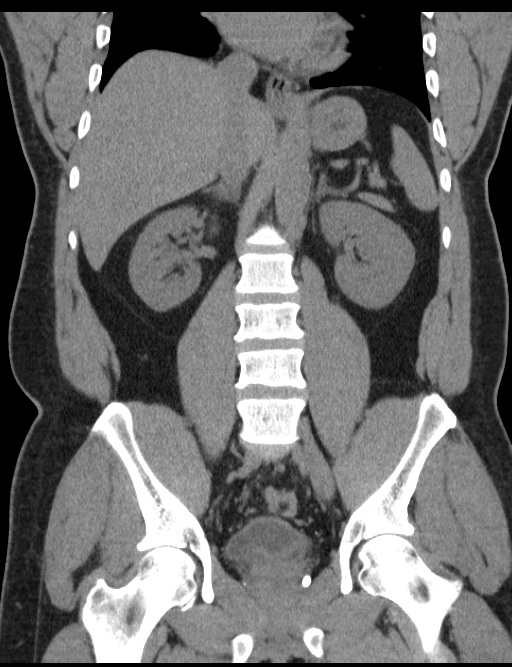

[17 of 46 positions shown; findings below may reference images not displayed]

FINDINGS: Mild obstruction of the right kidney with mild right hydronephrosis
and hydroureter. 2 mm stone in the distal right ureter at the UVJ
causing partial obstruction of the right kidney. Left kidney shows
no obstruction. No additional renal calculi. Bladder wall is
thickened.

Lung bases are clear. Liver and gallbladder are normal. Pancreas and
spleen are normal.

Negative for mass or adenopathy. The bowel appears normal. Normal
appendix. Prostate mildly enlarged. No acute bony abnormality.
IMPRESSION: 2 mm stone in the distal right ureter causing partial obstruction of
the right collecting system. Urinary bladder wall is thickened.

## 2016-01-17 DIAGNOSIS — E785 Hyperlipidemia, unspecified: Secondary | ICD-10-CM | POA: Insufficient documentation

## 2016-01-17 DIAGNOSIS — I10 Essential (primary) hypertension: Secondary | ICD-10-CM | POA: Insufficient documentation

## 2017-10-16 DIAGNOSIS — F419 Anxiety disorder, unspecified: Secondary | ICD-10-CM | POA: Insufficient documentation

## 2018-10-11 DIAGNOSIS — N183 Chronic kidney disease, stage 3 unspecified: Secondary | ICD-10-CM | POA: Insufficient documentation

## 2019-06-12 ENCOUNTER — Emergency Department (HOSPITAL_COMMUNITY): Payer: Medicare Other

## 2019-06-12 ENCOUNTER — Other Ambulatory Visit: Payer: Self-pay

## 2019-06-12 ENCOUNTER — Emergency Department (HOSPITAL_COMMUNITY)
Admission: EM | Admit: 2019-06-12 | Discharge: 2019-06-12 | Disposition: A | Discharge status: Home / Self Care | Payer: Medicare Other | Attending: Emergency Medicine | Admitting: Emergency Medicine

## 2019-06-12 ENCOUNTER — Encounter (HOSPITAL_COMMUNITY): Payer: Self-pay | Admitting: Emergency Medicine

## 2019-06-12 DIAGNOSIS — R519 Headache, unspecified: Secondary | ICD-10-CM | POA: Diagnosis not present

## 2019-06-12 DIAGNOSIS — Z794 Long term (current) use of insulin: Secondary | ICD-10-CM | POA: Diagnosis not present

## 2019-06-12 DIAGNOSIS — Z79899 Other long term (current) drug therapy: Secondary | ICD-10-CM | POA: Diagnosis not present

## 2019-06-12 DIAGNOSIS — I10 Essential (primary) hypertension: Secondary | ICD-10-CM | POA: Diagnosis not present

## 2019-06-12 DIAGNOSIS — Y92000 Kitchen of unspecified non-institutional (private) residence as  the place of occurrence of the external cause: Secondary | ICD-10-CM | POA: Insufficient documentation

## 2019-06-12 DIAGNOSIS — Y9389 Activity, other specified: Secondary | ICD-10-CM | POA: Diagnosis not present

## 2019-06-12 DIAGNOSIS — W01198A Fall on same level from slipping, tripping and stumbling with subsequent striking against other object, initial encounter: Secondary | ICD-10-CM | POA: Insufficient documentation

## 2019-06-12 DIAGNOSIS — R55 Syncope and collapse: Secondary | ICD-10-CM | POA: Diagnosis present

## 2019-06-12 DIAGNOSIS — R739 Hyperglycemia, unspecified: Secondary | ICD-10-CM

## 2019-06-12 LAB — CBC
HCT: 49.9 % (ref 39.0–52.0)
Hemoglobin: 16.3 g/dL (ref 13.0–17.0)
MCH: 29.8 pg (ref 26.0–34.0)
MCHC: 32.7 g/dL (ref 30.0–36.0)
MCV: 91.2 fL (ref 80.0–100.0)
Platelets: 378 10*3/uL (ref 150–400)
RBC: 5.47 MIL/uL (ref 4.22–5.81)
RDW: 11.7 % (ref 11.5–15.5)
WBC: 8.4 10*3/uL (ref 4.0–10.5)
nRBC: 0 % (ref 0.0–0.2)

## 2019-06-12 LAB — BASIC METABOLIC PANEL
Anion gap: 6 (ref 5–15)
BUN: 24 mg/dL — ABNORMAL HIGH (ref 6–20)
CO2: 23 mmol/L (ref 22–32)
Calcium: 9.8 mg/dL (ref 8.9–10.3)
Chloride: 103 mmol/L (ref 98–111)
Creatinine, Ser: 1.63 mg/dL — ABNORMAL HIGH (ref 0.61–1.24)
GFR calc Af Amer: 54 mL/min — ABNORMAL LOW (ref >60)
GFR calc non Af Amer: 46 mL/min — ABNORMAL LOW (ref >60)
Glucose, Bld: 319 mg/dL — ABNORMAL HIGH (ref 70–99)
Potassium: 3.7 mmol/L (ref 3.5–5.1)
Sodium: 132 mmol/L — ABNORMAL LOW (ref 135–145)

## 2019-06-12 LAB — URINALYSIS, ROUTINE W REFLEX MICROSCOPIC
Bacteria, UA: NONE SEEN
Bilirubin Urine: NEGATIVE
Glucose, UA: >=500 mg/dL — AB
Hgb urine dipstick: NEGATIVE
Ketones, ur: 5 mg/dL — AB
Leukocytes,Ua: NEGATIVE
Nitrite: NEGATIVE
Protein, ur: 30 mg/dL — AB
Specific Gravity, Urine: 1.032 — ABNORMAL HIGH (ref 1.005–1.030)
pH: 5 (ref 5.0–8.0)

## 2019-06-12 LAB — CBG MONITORING, ED
Glucose-Capillary: 332 mg/dL — ABNORMAL HIGH (ref 70–99)
Glucose-Capillary: 233 mg/dL — ABNORMAL HIGH (ref 70–99)

## 2019-06-12 MED ORDER — SODIUM CHLORIDE 0.9 % IV BOLUS
1000.0000 mL | Freq: Once | INTRAVENOUS | Status: AC
  Administered 2019-06-12: 1000 mL via INTRAVENOUS

## 2019-06-12 MED ORDER — SODIUM CHLORIDE 0.9% FLUSH: 3.0000 mL | Freq: Once | INTRAVENOUS | Status: DC
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
## 2019-06-12 NOTE — ED Notes (Signed)
Patient being transported to CT

## 2019-06-12 NOTE — ED Notes (Signed)
Provider at the bedside.  

## 2019-06-12 NOTE — ED Notes (Signed)
Pt ambulated in the hall with no ambulatory devices. Pt states he felt fine and was not dizzy. Pt is back in bed.
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
## 2019-06-12 NOTE — Discharge Instructions (Signed)
Please return to the emergency department for reevaluation if you develop any new or worsening symptoms.  It was a pleasure to meet you.

## 2019-12-15 ENCOUNTER — Other Ambulatory Visit: Payer: Self-pay | Admitting: Internal Medicine

## 2019-12-15 DIAGNOSIS — N1831 Chronic kidney disease, stage 3a: Secondary | ICD-10-CM

## 2019-12-15 DIAGNOSIS — N200 Calculus of kidney: Secondary | ICD-10-CM

## 2020-03-09 DIAGNOSIS — M5116 Intervertebral disc disorders with radiculopathy, lumbar region: Secondary | ICD-10-CM | POA: Insufficient documentation

## 2020-04-19 ENCOUNTER — Ambulatory Visit
Admission: RE | Admit: 2020-04-19 | Discharge: 2020-04-19 | Disposition: A | Discharge status: Home / Self Care | Payer: Medicare Other | Source: Ambulatory Visit | Attending: Internal Medicine | Admitting: Internal Medicine

## 2020-04-19 DIAGNOSIS — N200 Calculus of kidney: Secondary | ICD-10-CM

## 2020-04-19 DIAGNOSIS — N1831 Chronic kidney disease, stage 3a: Secondary | ICD-10-CM

## 2020-05-13 ENCOUNTER — Ambulatory Visit (HOSPITAL_COMMUNITY)
Admission: RE | Admit: 2020-05-13 | Discharge: 2020-05-13 | Disposition: A | Discharge status: Home / Self Care | Payer: Medicare Other | Attending: Endocrinology | Admitting: Endocrinology

## 2020-05-13 ENCOUNTER — Other Ambulatory Visit: Payer: Self-pay

## 2020-05-13 ENCOUNTER — Ambulatory Visit (HOSPITAL_COMMUNITY)
Admission: EM | Admit: 2020-05-13 | Discharge: 2020-05-14 | Disposition: A | Discharge status: Home / Self Care | Payer: Medicare Other | Attending: Family | Admitting: Family

## 2020-05-13 DIAGNOSIS — Z20822 Contact with and (suspected) exposure to covid-19: Secondary | ICD-10-CM | POA: Insufficient documentation

## 2020-05-13 DIAGNOSIS — Z79899 Other long term (current) drug therapy: Secondary | ICD-10-CM | POA: Insufficient documentation

## 2020-05-13 DIAGNOSIS — Z7984 Long term (current) use of oral hypoglycemic drugs: Secondary | ICD-10-CM | POA: Diagnosis not present

## 2020-05-13 DIAGNOSIS — Z9151 Personal history of suicidal behavior: Secondary | ICD-10-CM | POA: Diagnosis not present

## 2020-05-13 DIAGNOSIS — Z636 Dependent relative needing care at home: Secondary | ICD-10-CM | POA: Insufficient documentation

## 2020-05-13 DIAGNOSIS — Z733 Stress, not elsewhere classified: Secondary | ICD-10-CM | POA: Diagnosis not present

## 2020-05-13 DIAGNOSIS — F332 Major depressive disorder, recurrent severe without psychotic features: Secondary | ICD-10-CM | POA: Diagnosis not present

## 2020-05-13 DIAGNOSIS — F32A Depression, unspecified: Secondary | ICD-10-CM | POA: Diagnosis present

## 2020-05-13 DIAGNOSIS — R45851 Suicidal ideations: Secondary | ICD-10-CM | POA: Insufficient documentation

## 2020-05-13 DIAGNOSIS — Z794 Long term (current) use of insulin: Secondary | ICD-10-CM | POA: Diagnosis not present

## 2020-05-13 DIAGNOSIS — E1122 Type 2 diabetes mellitus with diabetic chronic kidney disease: Secondary | ICD-10-CM | POA: Insufficient documentation

## 2020-05-13 DIAGNOSIS — Z5989 Other problems related to housing and economic circumstances: Secondary | ICD-10-CM | POA: Insufficient documentation

## 2020-05-13 LAB — POCT URINE DRUG SCREEN - MANUAL ENTRY (I-SCREEN)
POC Amphetamine UR: NOT DETECTED
POC Buprenorphine (BUP): NOT DETECTED
POC Cocaine UR: NOT DETECTED
POC Marijuana UR: NOT DETECTED
POC Methadone UR: NOT DETECTED
POC Methamphetamine UR: NOT DETECTED
POC Morphine: NOT DETECTED
POC Oxazepam (BZO): POSITIVE — AB
POC Oxycodone UR: NOT DETECTED
POC Secobarbital (BAR): NOT DETECTED

## 2020-05-13 LAB — POC SARS CORONAVIRUS 2 AG: SARSCOV2ONAVIRUS 2 AG: NEGATIVE

## 2020-05-13 LAB — POC SARS CORONAVIRUS 2 AG -  ED: SARS Coronavirus 2 Ag: NEGATIVE

## 2020-05-13 MED ORDER — TRAZODONE HCL 50 MG PO TABS: 50.0000 mg | ORAL_TABLET | Freq: Every evening | ORAL | Status: DC | PRN

## 2020-05-13 MED ORDER — ALUM & MAG HYDROXIDE-SIMETH 200-200-20 MG/5ML PO SUSP: 30.0000 mL | ORAL | Status: DC | PRN

## 2020-05-13 MED ORDER — INSULIN GLARGINE 100 UNIT/ML ~~LOC~~ SOLN
20.0000 [IU] | Freq: Every day | SUBCUTANEOUS | Status: DC
  Administered 2020-05-13 – 2020-05-14 (×2): 20 [IU] via SUBCUTANEOUS

## 2020-05-13 MED ORDER — ATORVASTATIN CALCIUM 10 MG PO TABS
20.0000 mg | ORAL_TABLET | Freq: Every day | ORAL | Status: DC
  Administered 2020-05-14: 20 mg via ORAL
  Filled 2020-05-13: qty 2

## 2020-05-13 MED ORDER — LISINOPRIL-HYDROCHLOROTHIAZIDE 20-25 MG PO TABS: 1.0000 | ORAL_TABLET | Freq: Every day | ORAL | Status: DC

## 2020-05-13 MED ORDER — TAMSULOSIN HCL 0.4 MG PO CAPS
0.4000 mg | ORAL_CAPSULE | Freq: Every day | ORAL | Status: DC
  Administered 2020-05-14: 0.4 mg via ORAL
  Filled 2020-05-13: qty 1

## 2020-05-13 MED ORDER — ACETAMINOPHEN 325 MG PO TABS: 650.0000 mg | ORAL_TABLET | Freq: Four times a day (QID) | ORAL | Status: DC | PRN

## 2020-05-13 MED ORDER — HYDROCHLOROTHIAZIDE 25 MG PO TABS
25.0000 mg | ORAL_TABLET | Freq: Every day | ORAL | Status: DC
  Administered 2020-05-14: 25 mg via ORAL
  Filled 2020-05-13: qty 1

## 2020-05-13 MED ORDER — HYDROXYZINE HCL 25 MG PO TABS
25.0000 mg | ORAL_TABLET | Freq: Three times a day (TID) | ORAL | Status: DC | PRN
  Filled 2020-05-13: qty 1

## 2020-05-13 MED ORDER — LISINOPRIL 20 MG PO TABS
20.0000 mg | ORAL_TABLET | Freq: Every day | ORAL | Status: DC
  Administered 2020-05-14: 20 mg via ORAL
  Filled 2020-05-13: qty 1

## 2020-05-13 MED ORDER — MAGNESIUM HYDROXIDE 400 MG/5ML PO SUSP: 30.0000 mL | Freq: Every day | ORAL | Status: DC | PRN

## 2020-05-13 MED ORDER — LINAGLIPTIN 5 MG PO TABS
5.0000 mg | ORAL_TABLET | Freq: Every day | ORAL | Status: DC
  Administered 2020-05-13 – 2020-05-14 (×2): 5 mg via ORAL
  Filled 2020-05-13 (×2): qty 1

## 2020-05-13 MED ORDER — FLUOXETINE HCL 20 MG PO CAPS
20.0000 mg | ORAL_CAPSULE | Freq: Every day | ORAL | Status: DC
  Administered 2020-05-13 – 2020-05-14 (×2): 20 mg via ORAL
  Filled 2020-05-13 (×2): qty 1

## 2020-05-13 NOTE — ED Notes (Signed)
Patient is lying in bed with eyes closed, food has been offered and patient ate, patient drank water, patients medication has been given. Environment secured.

## 2020-05-13 NOTE — ED Notes (Signed)
Patient denies pain and is resting comfortably.  
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
## 2020-05-13 NOTE — ED Notes (Signed)
Locker #[id number]  Shoes, clothing, watch, Consulting civil engineer.Marland KitchenMarland KitchenMarland Kitchen

## 2020-05-13 NOTE — ED Provider Notes (Signed)
Behavioral Health Admission H&P [facility] & OBS)  Date: 05/13/20 Patient Name: Lance Chapman MRN: [id number] Chief Complaint:  Chief Complaint  Patient presents with  . Depression   Chief Complaint/Presenting Problem: Feeling overwhelmed providing care to mother with mild dementia  Diagnoses:  Final diagnoses:  MDD (major depressive disorder), recurrent severe, without psychosis (HCC)    HPI: Patient presents voluntarily to [facility] behavioral health for walk-in assessment.  He reports he is unable to contract for safety at this time.  He discusses suicidal ideations this morning with a plan to overdose on sleeping medications, he reports he did go to a store but was unable to find a sleeping medication.  Lance Chapman was assessed at [facility] behavioral health earlier today, discharged with resources.  He reports his brother and sister would prefer he remain at hospital overnight as they are concerned for his safety.  Lance Chapman reports current stressors include "financial problems, without enough money to pay bills."  Additional stressor is "seeing my mom in the shape she is in, her dementia is progressing."  Also patient reports he is charged with maintaining his mother's home and property and this can be overwhelming at times.  He reports a conversation with his mother that occurred 2 years ago that weighs heavily. Lance Chapman reports he overheard his mother saying "when I was pregnant (with Stanlee) I was upset because I did not want anymore kids, I hate him."  His mother has since attempted to retract this treatment but patient reports he thinks of this conversation frequently.  He is assessed by nurse practitioner.  He is alert and oriented, answers appropriately.  He is pleasant and cooperative during assessment.  He is future oriented, discussing "when things are better I will be okay."  He denies homicidal ideations.  He denies auditory and visual hallucinations.  There is no evidence of delusional  thought content and he denies symptoms of paranoia.  Lance Chapman reports he has been diagnosed with PTSD in the past.  He also endorses a history of multiple suicide attempts.  He states "I have attempted so many times, there is a reason why I am here."  He denies any current outpatient psychiatry follow-up.  He denies any current medications, reports he was prescribed Prozac in the 1980s and he felt this medication was effective to address his mood.  He resides with his mother.  He denies access to weapons.  He receives disability benefits.  He denies alcohol and substance use.  He endorses decreased sleep and appetite over the last several months.  He reports he has recently lost approximately 20 pounds, this has been intentional related to his diagnosis of diabetes.  Patient reports he is compliant with current medications prescribed by primary care.  Patient offered support and encouragement.  PHQ 2-9:     Total Time spent with patient: 30 minutes  Musculoskeletal  Strength & Muscle Tone: within normal limits Gait & Station: normal Patient leans: N/A  Psychiatric Specialty Exam  Presentation General Appearance: Appropriate for Environment; Casual  Eye Contact:Good  Speech:Clear and Coherent; Normal Rate  Speech Volume:Normal  Handedness:Right   Mood and Affect  Mood:Depressed  Affect:Congruent; Depressed   Thought Process  Thought Processes:Coherent; Goal Directed  Descriptions of Associations:Intact  Orientation:Full (Time, Place and Person)  Thought Content:Logical  Diagnosis of Schizophrenia or Schizoaffective disorder in past: No   Hallucinations:Hallucinations: None  Ideas of Reference:None  Suicidal Thoughts:Suicidal Thoughts: Yes, Active SI Active Intent and/or Plan: Without Intent; With Plan  Homicidal  Thoughts:Homicidal Thoughts: No   Sensorium  Memory:Immediate Good; Recent Good; Remote Good  Judgment:Good  Insight:Fair   Executive Functions   Concentration:Good  Attention Span:Good  Recall:Good  Fund of Knowledge:Good  Language:Good   Psychomotor Activity  Psychomotor Activity:Psychomotor Activity: Normal   Assets  Assets:Communication Skills; Desire for Improvement; Financial Resources/Insurance; Housing; Intimacy; Leisure Time; Physical Health; Resilience; Social Support; Talents/Skills; Transportation   Sleep  Sleep:Sleep: Fair   Nutritional Assessment (For OBS and [facility]BC admissions only) Has the patient had a weight loss or gain of 10 pounds or more in the last 3 months?: Yes Has the patient had a decrease in food intake/or appetite?: Yes (new diet to address DM- reports was attempting to lose weight) Does the patient have dental problems?: No Does the patient have eating habits or behaviors that may be indicators of an eating disorder including binging or inducing vomiting?: No Has the patient recently lost weight without trying?: No Has the patient been eating poorly because of a decreased appetite?: No Malnutrition Screening Tool Score: 0    Physical Exam Vitals and nursing note reviewed.  Constitutional:      Appearance: He is well-developed.  HENT:     Head: Normocephalic.  Cardiovascular:     Rate and Rhythm: Normal rate.  Pulmonary:     Effort: Pulmonary effort is normal.  Neurological:     Mental Status: He is alert and oriented to person, place, and time.  Psychiatric:        Attention and Perception: Attention and perception normal.        Mood and Affect: Affect normal. Mood is depressed.        Speech: Speech normal.        Behavior: Behavior normal. Behavior is cooperative.        Thought Content: Thought content includes suicidal ideation. Thought content includes suicidal plan.        Cognition and Memory: Cognition and memory normal.        Judgment: Judgment normal.    Review of Systems  Constitutional: Negative.   HENT: Negative.   Eyes: Negative.   Respiratory: Negative.    Cardiovascular: Negative.   Gastrointestinal: Negative.   Genitourinary: Negative.   Musculoskeletal: Negative.   Skin: Negative.   Neurological: Negative.   Endo/Heme/Allergies: Negative.   Psychiatric/Behavioral: Positive for depression and suicidal ideas.    Blood pressure (!) 144/94, pulse 95, temperature 98.1 F (36.7 C), temperature source Oral, resp. rate 18, SpO2 100 %. There is no height or weight on file to calculate BMI.  Past Psychiatric History: MDD   Is the patient at risk to self? Yes  Has the patient been a risk to self in the past 6 months? No .    Has the patient been a risk to self within the distant past? Yes   Is the patient a risk to others? No   Has the patient been a risk to others in the past 6 months? No   Has the patient been a risk to others within the distant past? No   Past Medical History:  Past Medical History:  Diagnosis Date  . Anxiety   . Back pain, chronic   . Depression   . Hypertension   . UTI (lower urinary tract infection)    No past surgical history on file.  Family History: No family history on file.  Social History:  Social History   Socioeconomic History  . Marital status: Single    Spouse name:  Not on file  . Number of children: Not on file  . Years of education: Not on file  . Highest education level: Not on file  Occupational History  . Not on file  Tobacco Use  . Smoking status: Never Smoker  . Smokeless tobacco: Not on file  Substance and Sexual Activity  . Alcohol use: No  . Drug use: No  . Sexual activity: Not on file  Other Topics Concern  . Not on file  Social History Narrative  . Not on file   Social Determinants of Health   Financial Resource Strain: Not on file  Food Insecurity: Not on file  Transportation Needs: Not on file  Physical Activity: Not on file  Stress: Not on file  Social Connections: Not on file  Intimate Partner Violence: Not on file    SDOH:  SDOH Screenings   Alcohol  Screen: Not on file  Depression (POE4-2): Not on file  Financial Resource Strain: Not on file  Food Insecurity: Not on file  Housing: Not on file  Physical Activity: Not on file  Social Connections: Not on file  Stress: Not on file  Tobacco Use: Unknown  . Smoking Tobacco Use: Never Smoker  . Smokeless Tobacco Use: Unknown  Transportation Needs: Not on file    Last Labs:  Admission on 05/13/2020  Component Date Value Ref Range Status  . POC Amphetamine UR 05/13/2020 None Detected  NONE DETECTED (Cut Off Level 1000 ng/mL) Final  . POC Secobarbital (BAR) 05/13/2020 None Detected  NONE DETECTED (Cut Off Level 300 ng/mL) Final  . POC Buprenorphine (BUP) 05/13/2020 None Detected  NONE DETECTED (Cut Off Level 10 ng/mL) Final  . POC Oxazepam (BZO) 05/13/2020 Positive* NONE DETECTED (Cut Off Level 300 ng/mL) Final  . POC Cocaine UR 05/13/2020 None Detected  NONE DETECTED (Cut Off Level 300 ng/mL) Final  . POC Methamphetamine UR 05/13/2020 None Detected  NONE DETECTED (Cut Off Level 1000 ng/mL) Final  . POC Morphine 05/13/2020 None Detected  NONE DETECTED (Cut Off Level 300 ng/mL) Final  . POC Oxycodone UR 05/13/2020 None Detected  NONE DETECTED (Cut Off Level 100 ng/mL) Final  . POC Methadone UR 05/13/2020 None Detected  NONE DETECTED (Cut Off Level 300 ng/mL) Final  . POC Marijuana UR 05/13/2020 None Detected  NONE DETECTED (Cut Off Level 50 ng/mL) Final  . SARS Coronavirus 2 Ag 05/13/2020 Negative  Negative Preliminary    Allergies: Bee venom, Benadryl allergy [diphenhydramine], Beeswax, and Benadryl [diphenhydramine hcl (sleep)]  PTA Medications: (Not in a hospital admission)   Medical Decision Making  Discussed initiation of Prozac, to address mood, as patient reports this medication has worked well for him in the past.  Discussed side effects, patient agrees with plan.  Medications: -Prozac 20 mg daily/mood -Trazodone 50 mg nightly as needed/sleep -Hydroxyzine 25 mg 3 times  daily as needed/anxiety Continue home medications:  -Tresiba Flextouch 100unit/ML Pen 20units daily -Lisinopril-hydrochlorothiazide 20-25mg - one tablet daily -Atorvastatin 20 mg daily - Tamsulosin 0.4 mg daily - Linagliptin 5 mg daily (this medication is formulary alternative for home medication Januvia 100 mg daily)  Recommendations  Based on my evaluation the patient does not appear to have an emergency medical condition.  Patient reviewed with Dr. Lucianne Muss.  Patient will be placed in the continuous assessment area at [facility] for treatment and stabilization.  He will be reevaluated on 05/15/2019, disposition will be determined at that time.  Lenard Lance, FNP 05/13/20  8:53 PM
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
## 2020-05-13 NOTE — ED Notes (Addendum)
Pt sleeping@this  time. Breathing even and unlabored. Alert and orient x 4. No pain or distress noted Will continue to monitor for safety

## 2020-05-14 ENCOUNTER — Encounter (HOSPITAL_COMMUNITY): Payer: Self-pay | Admitting: Emergency Medicine

## 2020-05-14 ENCOUNTER — Other Ambulatory Visit: Payer: Self-pay

## 2020-05-14 ENCOUNTER — Emergency Department (HOSPITAL_COMMUNITY)
Admission: EM | Admit: 2020-05-14 | Discharge: 2020-05-15 | Disposition: A | Discharge status: Other Acute Inpatient Hospital | Payer: Medicare Other | Attending: Emergency Medicine | Admitting: Emergency Medicine

## 2020-05-14 DIAGNOSIS — R45851 Suicidal ideations: Secondary | ICD-10-CM

## 2020-05-14 DIAGNOSIS — F332 Major depressive disorder, recurrent severe without psychotic features: Secondary | ICD-10-CM | POA: Insufficient documentation

## 2020-05-14 DIAGNOSIS — Z794 Long term (current) use of insulin: Secondary | ICD-10-CM | POA: Insufficient documentation

## 2020-05-14 DIAGNOSIS — Z79899 Other long term (current) drug therapy: Secondary | ICD-10-CM | POA: Diagnosis not present

## 2020-05-14 DIAGNOSIS — E119 Type 2 diabetes mellitus without complications: Secondary | ICD-10-CM | POA: Insufficient documentation

## 2020-05-14 DIAGNOSIS — I10 Essential (primary) hypertension: Secondary | ICD-10-CM | POA: Diagnosis not present

## 2020-05-14 DIAGNOSIS — Z7984 Long term (current) use of oral hypoglycemic drugs: Secondary | ICD-10-CM | POA: Insufficient documentation

## 2020-05-14 DIAGNOSIS — Z20822 Contact with and (suspected) exposure to covid-19: Secondary | ICD-10-CM | POA: Insufficient documentation

## 2020-05-14 LAB — RESP PANEL BY RT-PCR (FLU A&B, COVID) ARPGX2
Influenza A by PCR: NEGATIVE
Influenza A by PCR: NEGATIVE
Influenza B by PCR: NEGATIVE
Influenza B by PCR: NEGATIVE
SARS Coronavirus 2 by RT PCR: NEGATIVE
SARS Coronavirus 2 by RT PCR: NEGATIVE

## 2020-05-14 LAB — CBC WITH DIFFERENTIAL/PLATELET
Abs Immature Granulocytes: 0.03 10*3/uL (ref 0.00–0.07)
Basophils Absolute: 0 10*3/uL (ref 0.0–0.1)
Basophils Relative: 0 %
Eosinophils Absolute: 0.1 10*3/uL (ref 0.0–0.5)
Eosinophils Relative: 1 %
HCT: 47.4 % (ref 39.0–52.0)
Hemoglobin: 15.6 g/dL (ref 13.0–17.0)
Immature Granulocytes: 0 %
Lymphocytes Relative: 22 %
Lymphs Abs: 2.1 10*3/uL (ref 0.7–4.0)
MCH: 29.9 pg (ref 26.0–34.0)
MCHC: 32.9 g/dL (ref 30.0–36.0)
MCV: 90.8 fL (ref 80.0–100.0)
Monocytes Absolute: 0.5 10*3/uL (ref 0.1–1.0)
Monocytes Relative: 5 %
Neutro Abs: 6.6 10*3/uL (ref 1.7–7.7)
Neutrophils Relative %: 72 %
Platelets: 378 10*3/uL (ref 150–400)
RBC: 5.22 MIL/uL (ref 4.22–5.81)
RDW: 11.4 % — ABNORMAL LOW (ref 11.5–15.5)
WBC: 9.3 10*3/uL (ref 4.0–10.5)
nRBC: 0 % (ref 0.0–0.2)

## 2020-05-14 LAB — MAGNESIUM: Magnesium: 2.3 mg/dL (ref 1.7–2.4)

## 2020-05-14 LAB — ACETAMINOPHEN LEVEL: Acetaminophen (Tylenol), Serum: 11 ug/mL (ref 10–30)

## 2020-05-14 LAB — LIPID PANEL
Cholesterol: 167 mg/dL (ref 0–200)
HDL: 57 mg/dL (ref >40)
LDL Cholesterol: 92 mg/dL (ref 0–99)
Total CHOL/HDL Ratio: 2.9 RATIO
Triglycerides: 90 mg/dL (ref <150)
VLDL: 18 mg/dL (ref 0–40)

## 2020-05-14 LAB — COMPREHENSIVE METABOLIC PANEL
ALT: 49 U/L — ABNORMAL HIGH (ref 0–44)
ALT: 34 U/L (ref 0–44)
AST: 28 U/L (ref 15–41)
AST: 27 U/L (ref 15–41)
Albumin: 4.9 g/dL (ref 3.5–5.0)
Albumin: 4.3 g/dL (ref 3.5–5.0)
Alkaline Phosphatase: 61 U/L (ref 38–126)
Alkaline Phosphatase: 57 U/L (ref 38–126)
Anion gap: 11 (ref 5–15)
Anion gap: 8 (ref 5–15)
BUN: 24 mg/dL — ABNORMAL HIGH (ref 6–20)
BUN: 33 mg/dL — ABNORMAL HIGH (ref 6–20)
CO2: 29 mmol/L (ref 22–32)
CO2: 27 mmol/L (ref 22–32)
Calcium: 10.3 mg/dL (ref 8.9–10.3)
Calcium: 10 mg/dL (ref 8.9–10.3)
Chloride: 97 mmol/L — ABNORMAL LOW (ref 98–111)
Chloride: 100 mmol/L (ref 98–111)
Creatinine, Ser: 1.35 mg/dL — ABNORMAL HIGH (ref 0.61–1.24)
Creatinine, Ser: 1.56 mg/dL — ABNORMAL HIGH (ref 0.61–1.24)
GFR, Estimated: >60 mL/min (ref >60)
GFR, Estimated: 51 mL/min — ABNORMAL LOW (ref >60)
Glucose, Bld: 153 mg/dL — ABNORMAL HIGH (ref 70–99)
Glucose, Bld: 312 mg/dL — ABNORMAL HIGH (ref 70–99)
Potassium: 3.9 mmol/L (ref 3.5–5.1)
Potassium: 3.2 mmol/L — ABNORMAL LOW (ref 3.5–5.1)
Sodium: 137 mmol/L (ref 135–145)
Sodium: 135 mmol/L (ref 135–145)
Total Bilirubin: 0.8 mg/dL (ref 0.3–1.2)
Total Bilirubin: 0.3 mg/dL (ref 0.3–1.2)
Total Protein: 8.1 g/dL (ref 6.5–8.1)
Total Protein: 7.7 g/dL (ref 6.5–8.1)

## 2020-05-14 LAB — RAPID URINE DRUG SCREEN, HOSP PERFORMED
Amphetamines: NOT DETECTED
Barbiturates: NOT DETECTED
Benzodiazepines: NOT DETECTED
Cocaine: NOT DETECTED
Opiates: POSITIVE — AB
Tetrahydrocannabinol: NOT DETECTED

## 2020-05-14 LAB — HEMOGLOBIN A1C
Hgb A1c MFr Bld: 7.2 % — ABNORMAL HIGH (ref 4.8–5.6)
Mean Plasma Glucose: 159.94 mg/dL

## 2020-05-14 LAB — CBC
HCT: 41.7 % (ref 39.0–52.0)
Hemoglobin: 13.9 g/dL (ref 13.0–17.0)
MCH: 30.3 pg (ref 26.0–34.0)
MCHC: 33.3 g/dL (ref 30.0–36.0)
MCV: 91 fL (ref 80.0–100.0)
Platelets: 336 10*3/uL (ref 150–400)
RBC: 4.58 MIL/uL (ref 4.22–5.81)
RDW: 11.5 % (ref 11.5–15.5)
WBC: 8.9 10*3/uL (ref 4.0–10.5)
nRBC: 0 % (ref 0.0–0.2)

## 2020-05-14 LAB — SALICYLATE LEVEL: Salicylate Lvl: <7 mg/dL — ABNORMAL LOW (ref 7.0–30.0)

## 2020-05-14 LAB — TSH: TSH: 1.528 u[IU]/mL (ref 0.350–4.500)

## 2020-05-14 LAB — CBG MONITORING, ED: Glucose-Capillary: 280 mg/dL — ABNORMAL HIGH (ref 70–99)

## 2020-05-14 LAB — ETHANOL
Alcohol, Ethyl (B): <10 mg/dL (ref <10)
Alcohol, Ethyl (B): <10 mg/dL (ref <10)

## 2020-05-14 MED ORDER — LISINOPRIL 20 MG PO TABS
20.0000 mg | ORAL_TABLET | Freq: Every day | ORAL | Status: DC
  Administered 2020-05-14: 20 mg via ORAL
  Filled 2020-05-14: qty 1

## 2020-05-14 MED ORDER — DULOXETINE HCL 30 MG PO CPEP
60.0000 mg | ORAL_CAPSULE | Freq: Every day | ORAL | Status: DC
  Administered 2020-05-14: 60 mg via ORAL
  Filled 2020-05-14: qty 2

## 2020-05-14 MED ORDER — ATORVASTATIN CALCIUM 10 MG PO TABS
20.0000 mg | ORAL_TABLET | Freq: Every day | ORAL | Status: DC
  Administered 2020-05-14: 20 mg via ORAL
  Filled 2020-05-14: qty 2

## 2020-05-14 MED ORDER — TAMSULOSIN HCL 0.4 MG PO CAPS
0.4000 mg | ORAL_CAPSULE | Freq: Every day | ORAL | Status: DC
  Administered 2020-05-14: 0.4 mg via ORAL
  Filled 2020-05-14: qty 1

## 2020-05-14 MED ORDER — LINAGLIPTIN 5 MG PO TABS: 5.0000 mg | ORAL_TABLET | Freq: Every day | ORAL | Status: DC

## 2020-05-14 MED ORDER — LISINOPRIL-HYDROCHLOROTHIAZIDE 20-25 MG PO TABS: 1.0000 | ORAL_TABLET | Freq: Every day | ORAL | Status: DC

## 2020-05-14 MED ORDER — MELOXICAM 15 MG PO TABS
15.0000 mg | ORAL_TABLET | Freq: Every day | ORAL | Status: DC | PRN
  Filled 2020-05-14: qty 1

## 2020-05-14 MED ORDER — HYDROCHLOROTHIAZIDE 25 MG PO TABS
25.0000 mg | ORAL_TABLET | Freq: Every day | ORAL | Status: DC
  Administered 2020-05-14: 25 mg via ORAL
  Filled 2020-05-14: qty 1

## 2020-05-14 MED ORDER — FLUOXETINE HCL 20 MG PO CAPS
20.0000 mg | ORAL_CAPSULE | Freq: Every day | ORAL | Status: DC
Start: 2020-05-14 — End: 2020-05-15
  Administered 2020-05-14: 20 mg via ORAL
  Filled 2020-05-14: qty 1

## 2020-05-14 MED ORDER — CYCLOBENZAPRINE HCL 10 MG PO TABS: 10.0000 mg | ORAL_TABLET | Freq: Three times a day (TID) | ORAL | Status: DC | PRN

## 2020-05-14 MED ORDER — FLUOXETINE HCL 20 MG PO CAPS: 20.0000 mg | ORAL_CAPSULE | Freq: Every day | ORAL | 0 refills | Status: DC

## 2020-05-14 MED ORDER — TRAMADOL HCL 50 MG PO TABS
50.0000 mg | ORAL_TABLET | Freq: Every day | ORAL | Status: DC | PRN
  Administered 2020-05-14: 50 mg via ORAL
  Filled 2020-05-14: qty 1

## 2020-05-14 MED ORDER — EMPAGLIFLOZIN 10 MG PO TABS: 10.0000 mg | ORAL_TABLET | Freq: Every day | ORAL | Status: DC

## 2020-05-14 NOTE — Discharge Instructions (Addendum)
Patient is instructed to take all prescribed medications as recommended. Report any side effects or adverse reactions to your outpatient psychiatrist. Patient is instructed to abstain from alcohol and illegal drugs while on prescription medications. In the event of worsening symptoms, patient is instructed to call the crisis hotline, 911, or go to the nearest emergency department for evaluation and treatment.   

## 2020-05-14 NOTE — ED Notes (Signed)
Discharge instructions provided and Pt stated understanding. Personal belongings returned from locker. Pt alert, orient and ambulatory. Pt escorted to the front lobby to meet his brother to go home. Safety maintained.

## 2020-05-14 NOTE — ED Provider Notes (Signed)
[facility]-EMERGENCY DEPT Provider Note   CSN: [id number] Arrival date & time: 05/14/20  2057     History Chief Complaint  Patient presents with  . Suicidal    Lance Chapman is a 55 y.o. male.  55 year old male presents with suicidal ideations history of suicide attempt in the past by ingestion.  Denies any active attempt at this time.  Was seen at the [facility] yesterday and kept overnight and was felt stable for discharge.  Patient states he has a follow-up appointment on Monday but did not feel he can make it.  States that the suicidal thoughts have been recurring.  No recent use of alcohol or illicit drugs.  Has not been responding to internal stimuli.        Past Medical History:  Diagnosis Date  . Anxiety   . Back pain, chronic   . Depression   . Hypertension   . UTI (lower urinary tract infection)     There are no problems to display for this patient.   History reviewed. No pertinent surgical history.     History reviewed. No pertinent family history.  Social History   Tobacco Use  . Smoking status: Never Smoker  Substance Use Topics  . Alcohol use: No  . Drug use: No    Home Medications Prior to Admission medications   Medication Sig Start Date End Date Taking? Authorizing Provider  atorvastatin (LIPITOR) 20 MG tablet Take 20 mg by mouth daily. 02/05/19   [provider]  cyclobenzaprine (FLEXERIL) 10 MG tablet Take 10 mg by mouth 3 (three) times daily as needed for muscle spasms.     [provider]  DULoxetine (CYMBALTA) 60 MG capsule Take 60 mg by mouth daily.      [provider]  FLUoxetine (PROZAC) 20 MG capsule Take 1 capsule (20 mg total) by mouth daily. 05/14/20   Bobbye Morton, MD  HYDROcodone-acetaminophen (NORCO) 5-325 MG per tablet Take 1-2 tablets by mouth every 4 (four) hours as needed. Patient taking differently: Take 1-2 tablets by mouth daily as needed for  moderate pain.  08/19/13   Karamalegos, Netta Neat, DO  lisinopril-hydrochlorothiazide (PRINZIDE,ZESTORETIC) 20-25 MG per tablet Take 1 tablet by mouth daily.      [provider]  meloxicam (MOBIC) 15 MG tablet Take 15 mg by mouth daily as needed for pain.     [provider]  sitaGLIPtin (JANUVIA) 100 MG tablet Take 100 mg by mouth daily.    [provider]  tamsulosin (FLOMAX) 0.4 MG CAPS capsule Take 1 capsule (0.4 mg total) by mouth daily. 08/19/13   Karamalegos, Netta Neat, DO  traMADol (ULTRAM) 50 MG tablet Take 50 mg by mouth daily as needed for moderate pain.     [provider]  TRESIBA FLEXTOUCH 100 UNIT/ML FlexTouch Pen Inject 20 Units into the skin daily. 06/11/19   [provider]    Allergies    Bee venom, Benadryl allergy [diphenhydramine], Beeswax, and Benadryl [diphenhydramine hcl (sleep)]  Review of Systems   Review of Systems  All other systems reviewed and are negative.   Physical Exam Updated Vital Signs BP (!) 142/81 (BP Location: Left Arm)   Pulse (!) 107   Temp 98.2 F (36.8 C) (Oral)   Resp 16   SpO2 95%   Physical Exam Vitals and nursing note reviewed.  Constitutional:      General: He is not in acute distress.  Appearance: Normal appearance. He is well-developed. He is not toxic-appearing.  HENT:     Head: Normocephalic and atraumatic.  Eyes:     General: Lids are normal.     Conjunctiva/sclera: Conjunctivae normal.     Pupils: Pupils are equal, round, and reactive to light.  Neck:     Thyroid: No thyroid mass.     Trachea: No tracheal deviation.  Cardiovascular:     Rate and Rhythm: Normal rate and regular rhythm.     Heart sounds: Normal heart sounds. No murmur heard. No gallop.   Pulmonary:     Effort: Pulmonary effort is normal. No respiratory distress.     Breath sounds: Normal breath sounds. No stridor. No decreased breath sounds, wheezing, rhonchi or rales.  Abdominal:     General: Bowel  sounds are normal. There is no distension.     Palpations: Abdomen is soft.     Tenderness: There is no abdominal tenderness. There is no rebound.  Musculoskeletal:        General: No tenderness. Normal range of motion.     Cervical back: Normal range of motion and neck supple.  Skin:    General: Skin is warm and dry.     Findings: No abrasion or rash.  Neurological:     Mental Status: He is alert and oriented to person, place, and time.     GCS: GCS eye subscore is 4. GCS verbal subscore is 5. GCS motor subscore is 6.     Cranial Nerves: No cranial nerve deficit.     Sensory: No sensory deficit.  Psychiatric:        Attention and Perception: Attention normal.        Mood and Affect: Affect is blunt.        Speech: Speech normal.        Behavior: Behavior normal. Behavior is cooperative.        Thought Content: Thought content does not include homicidal or suicidal plan.     ED Results / Procedures / Treatments   Labs (all labs ordered are listed, but only abnormal results are displayed) Labs Reviewed  RESP PANEL BY RT-PCR (FLU A&B, COVID) ARPGX2  COMPREHENSIVE METABOLIC PANEL  ETHANOL  SALICYLATE LEVEL  ACETAMINOPHEN LEVEL  CBC  RAPID URINE DRUG SCREEN, HOSP PERFORMED  CBG MONITORING, ED    EKG None  Radiology No results found.  Procedures Procedures   Medications Ordered in ED Medications  atorvastatin (LIPITOR) tablet 20 mg (has no administration in time range)  cyclobenzaprine (FLEXERIL) tablet 10 mg (has no administration in time range)  DULoxetine (CYMBALTA) DR capsule 60 mg (has no administration in time range)  FLUoxetine (PROZAC) capsule 20 mg (has no administration in time range)  tamsulosin (FLOMAX) capsule 0.4 mg (has no administration in time range)  linagliptin (TRADJENTA) tablet 5 mg (has no administration in time range)  lisinopril-hydrochlorothiazide (ZESTORETIC) 20-25 MG per tablet 1 tablet (has no administration in time range)  traMADol  (ULTRAM) tablet 50 mg (has no administration in time range)  meloxicam (MOBIC) tablet 15 mg (has no administration in time range)    ED Course  I have reviewed the triage vital signs and the nursing notes.  Pertinent labs & imaging results that were available during my care of the patient were reviewed by me and considered in my medical decision making (see chart for details).    MDM Rules/Calculators/A&P  Will check patient's blood sugar here and medications restarted for his diabetes.  Will consult TTS for placement Final Clinical Impression(s) / ED Diagnoses Final diagnoses:  None    Rx / DC Orders ED Discharge Orders    None       Lorre Nick, MD 05/14/20 2135
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
## 2020-05-14 NOTE — ED Notes (Addendum)
Patient ate sandwich and changed into purple scrubs. One patient belonging bag with 6 bottles of medication, clothing, watch and wallet in cabinet 23-25

## 2020-05-14 NOTE — ED Triage Notes (Signed)
Pt reports SI. States that he was in [place] yesterday and had a plan to buy and OD on sleeping pills, but he did not do it. Was seen at [facility] yesterday for MDD. Hx diabetes.

## 2020-05-14 NOTE — ED Notes (Signed)
I collected and sent the UDS. The RN closed the order out as complete before I was able to. Grayling Congress, NT

## 2020-05-14 NOTE — ED Notes (Signed)
One visitor in room, no sitter available, keeping door open

## 2020-05-14 NOTE — ED Notes (Signed)
Pt is currently sitting in the chair next to his bed. Breathing unlabored. No distress witnessed. Environment checked and secured, will continue to monitor patient

## 2020-05-14 NOTE — ED Provider Notes (Addendum)
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 Patient' siblings were concerned for his well being.  Stay Summary:  Patient slept ok; however there was another patient on the unit who snored very loudly disrupting his sleep. Patient had no behavior issues on the unit. On reassessment in the AM the patient denied SI, HI, and AVH and reported he felt well to go home. Patient reported that his brother took their mom in order to give him a break when he returns home. Patient was initiated on Prozac as he had been taking this in the past and had success. Patient A1c was noted to be significantly improved at 7.2 and TSH was WNL. Patient UDS was normal. Patient QTc was 435.  Total Time spent with patient: 15 minutes  Past Psychiatric History: MDD Past Medical History:  Past Medical History:  Diagnosis Date  . Anxiety   . Back pain, chronic   . Depression   . Hypertension   . UTI (lower urinary tract infection)    No past surgical history on file. Family History: No family history on file. Family Psychiatric History: Unknown Social History:  Social History   Substance and Sexual Activity  Alcohol Use No     Social History   Substance and Sexual Activity  Drug Use No    Social History   Socioeconomic History  . Marital status: Single    Spouse name: Not on file  . Number of children: Not on file  . Years of education: Not on file  . Highest education level: Not on file  Occupational History  . Not on file  Tobacco Use  . Smoking status: Never Smoker  . Smokeless tobacco: Not on file  Substance and Sexual Activity  . Alcohol use: No  . Drug use: No   . Sexual activity: Not on file  Other Topics Concern  . Not on file  Social History Narrative  . Not on file   Social Determinants of Health   Financial Resource Strain: Not on file  Food Insecurity: Not on file  Transportation Needs: Not on file  Physical Activity: Not on file  Stress: Not on file  Social Connections: Not on file   SDOH:  SDOH Screenings   Alcohol Screen: Not on file  Depression (BDZ3-2): Not on file  Financial Resource Strain: Not on file  Food Insecurity: Not on file  Housing: Not on file  Physical Activity: Not on file  Social Connections: Not on file  Stress: Not on file  Tobacco Use: Unknown  . Smoking Tobacco Use: Never Smoker  . Smokeless Tobacco Use: Unknown  Transportation Needs: Not on file    Has this patient used any form of tobacco in the last 30 days? (Cigarettes, Smokeless Tobacco, Cigars, and/or Pipes) Prescription not provided because: patient does not use products  Current Medications:  Current Facility-Administered Medications  Medication Dose Route Frequency Provider Last Rate Last Admin  . acetaminophen (TYLENOL) tablet 650 mg  650 mg Oral Q6H PRN Lenard Lance, FNP      . alum & mag hydroxide-simeth (MAALOX/MYLANTA) 200-200-20 MG/5ML suspension 30 mL  30 mL Oral Q4H  PRN Lenard Lance, FNP      . atorvastatin (LIPITOR) tablet 20 mg  20 mg Oral Daily Lenard Lance, FNP      . FLUoxetine (PROZAC) capsule 20 mg  20 mg Oral Daily Lenard Lance, FNP   20 mg at 05/13/20 2129  . lisinopril (ZESTRIL) tablet 20 mg  20 mg Oral Daily Green, Terri L, RPH       And  . hydrochlorothiazide (HYDRODIURIL) tablet 25 mg  25 mg Oral Daily Green, Terri L, RPH      . hydrOXYzine (ATARAX/VISTARIL) tablet 25 mg  25 mg Oral TID PRN Lenard Lance, FNP      . insulin glargine (LANTUS) injection 20 Units  20 Units Subcutaneous Daily Lenard Lance, FNP   20 Units at 05/13/20 2129  . linagliptin (TRADJENTA) tablet 5 mg  5 mg Oral Daily Lenard Lance, FNP   5  mg at 05/13/20 2128  . magnesium hydroxide (MILK OF MAGNESIA) suspension 30 mL  30 mL Oral Daily PRN Lenard Lance, FNP      . tamsulosin (FLOMAX) capsule 0.4 mg  0.4 mg Oral Daily Lenard Lance, FNP      . traZODone (DESYREL) tablet 50 mg  50 mg Oral QHS PRN Lenard Lance, FNP       Current Outpatient Medications  Medication Sig Dispense Refill  . atorvastatin (LIPITOR) 20 MG tablet Take 20 mg by mouth daily.    . cyclobenzaprine (FLEXERIL) 10 MG tablet Take 10 mg by mouth 3 (three) times daily as needed for muscle spasms.     . DULoxetine (CYMBALTA) 60 MG capsule Take 60 mg by mouth daily.      [name] FLUoxetine (PROZAC) 40 MG capsule Take 40 mg by mouth daily.    [name] HYDROcodone-acetaminophen (NORCO) 5-325 MG per tablet Take 1-2 tablets by mouth every 4 (four) hours as needed. (Patient taking differently: Take 1-2 tablets by mouth daily as needed for moderate pain. ) 20 tablet 0  . lisinopril-hydrochlorothiazide (PRINZIDE,ZESTORETIC) 20-25 MG per tablet Take 1 tablet by mouth daily.      . meloxicam (MOBIC) 15 MG tablet Take 15 mg by mouth daily as needed for pain.     . sitaGLIPtin (JANUVIA) 100 MG tablet Take 100 mg by mouth daily.    . tamsulosin (FLOMAX) 0.4 MG CAPS capsule Take 1 capsule (0.4 mg total) by mouth daily. 30 capsule 0  . traMADol (ULTRAM) 50 MG tablet Take 50 mg by mouth daily as needed for moderate pain.     Evaristo Bury FLEXTOUCH 100 UNIT/ML FlexTouch Pen Inject 20 Units into the skin daily.      PTA Medications: (Not in a hospital admission)   Musculoskeletal  Strength & Muscle Tone: within normal limits Gait & Station: normal Patient leans: N/A  Psychiatric Specialty Exam  Presentation  General Appearance: Appropriate for Environment  Eye Contact:Good  Speech:Clear and Coherent  Speech Volume:Normal  Handedness:-- (Defer)   Mood and Affect  Mood:Euthymic  Affect:Congruent   Thought Process  Thought Processes:Coherent  Descriptions of  Associations:Intact  Orientation:Full (Time, Place and Person)  Thought Content:Logical  Diagnosis of Schizophrenia or Schizoaffective disorder in past: No    Hallucinations:Hallucinations: None  Ideas of Reference:None  Suicidal Thoughts:Suicidal Thoughts: Yes, Active SI Active Intent and/or Plan: Without Intent; With Plan  Homicidal Thoughts:Homicidal Thoughts: No   Sensorium  Memory:Immediate Good; Recent Good; Remote Good  Judgment:Good  Insight:Good   Executive Functions  Concentration:Good  Attention Span:Good  Recall:Good  Fund of Knowledge:Good  Language:Good   Psychomotor Activity  Psychomotor Activity:Psychomotor Activity: Normal   Assets  Assets:Communication Skills; Desire for Improvement; Housing; Resilience; Social Support   Sleep  Sleep:Sleep: Fair   Nutritional Assessment (For OBS and FBC admissions only) Has the patient had a weight loss or gain of 10 pounds or more in the last 3 months?: Yes Has the patient had a decrease in food intake/or appetite?: Yes (new diet to address DM- reports was attempting to lose weight) Does the patient have dental problems?: No Does the patient have eating habits or behaviors that may be indicators of an eating disorder including binging or inducing vomiting?: No Has the patient recently lost weight without trying?: No Has the patient been eating poorly because of a decreased appetite?: No Malnutrition Screening Tool Score: 0    Physical Exam  Physical Exam Constitutional:      Appearance: Normal appearance.  HENT:     Head: Normocephalic and atraumatic.     Nose: Nose normal.  Eyes:     Extraocular Movements: Extraocular movements intact.     Pupils: Pupils are equal, round, and reactive to light.  Cardiovascular:     Rate and Rhythm: Normal rate.     Pulses: Normal pulses.  Pulmonary:     Effort: Pulmonary effort is normal.  Musculoskeletal:        General: Normal range of motion.  Skin:     General: Skin is warm and dry.  Neurological:     General: No focal deficit present.     Mental Status: He is alert.    Review of Systems  Constitutional: Negative for chills and fever.  HENT: Negative for hearing loss.   Eyes: Negative for blurred vision.  Respiratory: Negative for cough and wheezing.   Cardiovascular: Negative for chest pain.  Gastrointestinal: Negative for abdominal pain.  Neurological: Negative for dizziness.   Blood pressure (!) 144/94, pulse 95, temperature 98.1 F (36.7 C), temperature source Oral, resp. rate 18, SpO2 100 %. There is no height or weight on file to calculate BMI.  Demographic Factors:  Male  Loss Factors: Loss of significant relationship  Historical Factors: NA  Risk Reduction Factors:   Sense of responsibility to family and Positive social support  Continued Clinical Symptoms:    Cognitive Features That Contribute To Risk:  None    Suicide Risk:  Minimal: No identifiable suicidal ideation.  Patients presenting with no risk factors but with morbid ruminations; may be classified as minimal risk based on the severity of the depressive symptoms  Plan Of Care/Follow-up recommendations:  Follow up recommendations: - Activity as tolerated. - Diet as recommended by PCP. - Keep all scheduled follow-up appointments as recommended.   Disposition:  Patient will be discharged home and with a prescription for Prozac 20mg  daily.  Spoke extensively with patient's brother in person and sister over brother's phone . Brother reports that he has removed grandmother from the home and he will be checking in on the patient and will also do his best to make sure the patient returns next week for a walk-in appt at [facility]. Sister reports she remains concerned and requested that patient have a 30 day hospitalization as she reports he has a long hx of depression. It was clarified with sister over the phone that patient does not meet criteria for inpatient  treatment and 30 day hospitalization is exceedingly rare today.  Patient was noted to be very social and  cheerful with staff while provider spoke with patient's siblings in the lobby.  PGY-1 Bobbye MortonJai B Adaline Trejos, MD 05/14/2020, 8:08 AM

## 2020-05-15 ENCOUNTER — Ambulatory Visit (HOSPITAL_COMMUNITY)
Admission: EM | Admit: 2020-05-15 | Discharge: 2020-05-16 | Disposition: A | Discharge status: Home / Self Care | Payer: Medicare Other | Attending: Urology | Admitting: Urology

## 2020-05-15 ENCOUNTER — Other Ambulatory Visit: Payer: Self-pay

## 2020-05-15 DIAGNOSIS — F332 Major depressive disorder, recurrent severe without psychotic features: Secondary | ICD-10-CM | POA: Diagnosis not present

## 2020-05-15 DIAGNOSIS — Z79899 Other long term (current) drug therapy: Secondary | ICD-10-CM | POA: Insufficient documentation

## 2020-05-15 DIAGNOSIS — R45851 Suicidal ideations: Secondary | ICD-10-CM | POA: Insufficient documentation

## 2020-05-15 DIAGNOSIS — F4310 Post-traumatic stress disorder, unspecified: Secondary | ICD-10-CM | POA: Insufficient documentation

## 2020-05-15 MED ORDER — ACETAMINOPHEN 325 MG PO TABS: 650.0000 mg | ORAL_TABLET | Freq: Four times a day (QID) | ORAL | Status: DC | PRN

## 2020-05-15 MED ORDER — ALUM & MAG HYDROXIDE-SIMETH 200-200-20 MG/5ML PO SUSP: 30.0000 mL | ORAL | Status: DC | PRN

## 2020-05-15 MED ORDER — MAGNESIUM HYDROXIDE 400 MG/5ML PO SUSP: 30.0000 mL | Freq: Every day | ORAL | Status: DC | PRN

## 2020-05-15 MED ORDER — HYDROXYZINE HCL 25 MG PO TABS
25.0000 mg | ORAL_TABLET | Freq: Three times a day (TID) | ORAL | Status: DC | PRN
  Administered 2020-05-15: 25 mg via ORAL
  Filled 2020-05-15: qty 1

## 2020-05-15 MED ORDER — TRAZODONE HCL 50 MG PO TABS
50.0000 mg | ORAL_TABLET | Freq: Every evening | ORAL | Status: DC | PRN
  Administered 2020-05-15: 50 mg via ORAL
  Filled 2020-05-15: qty 1

## 2020-05-15 NOTE — ED Notes (Signed)
Safe transport called 

## 2020-05-15 NOTE — Discharge Instructions (Signed)
[facility]-will provide timely access to mental health services for children and adolescents (4-17) and adults presenting in a mental health crisis. The program is designed for those who need urgent Behavioral Health or Substance Use treatment and are not experiencing a medical crisis that would typically require an emergency room visit.    [address] [place], [place] [address] Phone: [phone] [email or web address]   The [facility] will also offer the following outpatient services: (Monday through Friday 8am-5pm)    Partial Hospitalization Program (PHP)  Substance Abuse Intensive Outpatient Program (SA-IOP)  Group Therapy  Medication Management  Peer Living Room   We also provide (24/7):    Assessments: Our mental health clinician and providers will conduct a focused mental health evaluation, assessing for immediate safety concerns and further mental health needs.   Referral: Our team will provide resources and help connect to community based mental health treatment, when indicated, including psychotherapy, psychiatry, and other specialized behavioral health or substance use disorder services (for those not already in treatment).   Transitional Care: Our team providers in person bridging and/or telphonic follow-up during the patient's transition to outpatient services.      

## 2020-05-15 NOTE — ED Notes (Signed)
Pt sleeping at present, no distress noted, calm & cooperative.  Monitoring for safety. 

## 2020-05-15 NOTE — ED Notes (Signed)
Pt resting at present, no distress noted, monitoring for safety. 

## 2020-05-15 NOTE — Progress Notes (Signed)
CSW provided the following resources to the patient listed below through the AVS system:   [facility] provide timely access to mental health services for children and adolescents (4-17) and adults presenting in a mental health crisis. The program is designed for those who need urgent Behavioral Health or Substance Use treatment and are not experiencing a medical crisis that would typically require an emergency room visit.    [address] [place], [place] [address] Phone: [phone] [email or web address]   [facility] will also offer the following outpatient services: (Monday through Friday 8am-5pm)    Partial Hospitalization Program (PHP)  Substance Abuse Intensive Outpatient Program (SA-IOP)  Group Therapy  Medication Management  Peer Living Room   We also provide (24/7):    Assessments: Our mental health clinician and providers will conduct a focused mental health evaluation, assessing for immediate safety concerns and further mental health needs.   Referral: Our team will provide resources and help connect to community based mental health treatment, when indicated, including psychotherapy, psychiatry, and other specialized behavioral health or substance use disorder services (for those not already in treatment).   Transitional Care: Our team providers in person bridging and/or telphonic follow-up during the patient's transition to outpatient services.   Crissie Reese, MSW, LCSW-A, LCAS-A Phone: [phone] Disposition/TOC

## 2020-05-15 NOTE — ED Notes (Signed)
Pt A&O x 4, presents with suicidal ideations, plan to overdose on meds.  Pt unable to contract for safety.  Denies HI or AVH.  Pt calm & cooperative.  Skin search completed, monitoring for safety.

## 2020-05-15 NOTE — ED Provider Notes (Addendum)
Behavioral Health Progress Note  Date and Time: 05/15/2020 12:37 PM Name: Lance Chapman MRN:  [id number]  Subjective:  Mr. Moreland is a 55 yo w/ PPH of MDD who presented to the [facility] less than 5 hours after being discharged per EMR w/ SI.  On assessment today patient denies he had SI, but reports that he was having "flashbacks" from being back in his home. Patient reports that he was recalling things his mother had said to him "a long time ago." On assessment today patient denies SI, HI, and AVH. Patient reports he remains interested in going to the [facility]in clinic tom. Morning but was not sure how safe he would feel returning home.   Diagnosis:  Final diagnoses:  MDD (major depressive disorder), recurrent severe, without psychosis (HCC)    Total Time spent with patient: 15 minutes  Past Psychiatric History: MDD, PTSD dx in 2015 per patient Past Medical History:  Past Medical History:  Diagnosis Date  . Anxiety   . Back pain, chronic   . Depression   . Hypertension   . UTI (lower urinary tract infection)    No past surgical history on file. Family History: No family history on file. Family Psychiatric  History: Unknown Social History:  Social History   Substance and Sexual Activity  Alcohol Use No     Social History   Substance and Sexual Activity  Drug Use No    Social History   Socioeconomic History  . Marital status: Single    Spouse name: Not on file  . Number of children: Not on file  . Years of education: Not on file  . Highest education level: Not on file  Occupational History  . Not on file  Tobacco Use  . Smoking status: Never Smoker  . Smokeless tobacco: Not on file  Substance and Sexual Activity  . Alcohol use: No  . Drug use: No  . Sexual activity: Not on file  Other Topics Concern  . Not on file  Social History Narrative  . Not on file   Social Determinants of Health   Financial Resource Strain: Not on file  Food Insecurity: Not on file   Transportation Needs: Not on file  Physical Activity: Not on file  Stress: Not on file  Social Connections: Not on file   SDOH:  SDOH Screenings   Alcohol Screen: Not on file  Depression (WGN5-6): Not on file  Financial Resource Strain: Not on file  Food Insecurity: Not on file  Housing: Not on file  Physical Activity: Not on file  Social Connections: Not on file  Stress: Not on file  Tobacco Use: Unknown  . Smoking Tobacco Use: Never Smoker  . Smokeless Tobacco Use: Unknown  Transportation Needs: Not on file   Additional Social History:                         Sleep: Fair  Appetite:  Good  Current Medications:  Current Facility-Administered Medications  Medication Dose Route Frequency Provider Last Rate Last Admin  . acetaminophen (TYLENOL) tablet 650 mg  650 mg Oral Q6H PRN Ajibola, Ene A, NP      . alum & mag hydroxide-simeth (MAALOX/MYLANTA) 200-200-20 MG/5ML suspension 30 mL  30 mL Oral Q4H PRN Ajibola, Ene A, NP      . hydrOXYzine (ATARAX/VISTARIL) tablet 25 mg  25 mg Oral TID PRN Ajibola, Ene A, NP   25 mg at 05/15/20 0304  . magnesium  hydroxide (MILK OF MAGNESIA) suspension 30 mL  30 mL Oral Daily PRN Ajibola, Ene A, NP      . traZODone (DESYREL) tablet 50 mg  50 mg Oral QHS PRN Ajibola, Ene A, NP   50 mg at 05/15/20 0304   Current Outpatient Medications  Medication Sig Dispense Refill  . atorvastatin (LIPITOR) 20 MG tablet Take 20 mg by mouth daily.    . cyclobenzaprine (FLEXERIL) 10 MG tablet Take 10 mg by mouth 3 (three) times daily as needed for muscle spasms.     . DULoxetine (CYMBALTA) 60 MG capsule Take 60 mg by mouth daily.      [name] FLUoxetine (PROZAC) 20 MG capsule Take 1 capsule (20 mg total) by mouth daily. 30 capsule 0  . HYDROcodone-acetaminophen (NORCO) 5-325 MG per tablet Take 1-2 tablets by mouth every 4 (four) hours as needed. (Patient taking differently: Take 1-2 tablets by mouth daily as needed for moderate pain. ) 20 tablet 0  .  JARDIANCE 10 MG TABS tablet Take 10 mg by mouth daily.    [name] lisinopril-hydrochlorothiazide (PRINZIDE,ZESTORETIC) 20-25 MG per tablet Take 1 tablet by mouth daily.      . meloxicam (MOBIC) 15 MG tablet Take 15 mg by mouth daily as needed for pain.     [name] OZEMPIC, 0.25 OR 0.5 MG/DOSE, 2 MG/1.5ML SOPN SMARTSIG:0.3 Milliliter(s) SUB-Q Once a Week    . sitaGLIPtin (JANUVIA) 100 MG tablet Take 100 mg by mouth daily.    . tamsulosin (FLOMAX) 0.4 MG CAPS capsule Take 1 capsule (0.4 mg total) by mouth daily. 30 capsule 0  . traMADol (ULTRAM) 50 MG tablet Take 50 mg by mouth daily as needed for moderate pain.     Evaristo Bury FLEXTOUCH 100 UNIT/ML FlexTouch Pen Inject 20 Units into the skin daily.      Labs  Lab Results:  Admission on 05/14/2020, Discharged on 05/15/2020  Component Date Value Ref Range Status  . Sodium 05/14/2020 135  135 - 145 mmol/L Final  . Potassium 05/14/2020 3.2* 3.5 - 5.1 mmol/L Final  . Chloride 05/14/2020 100  98 - 111 mmol/L Final  . CO2 05/14/2020 27  22 - 32 mmol/L Final  . Glucose, Bld 05/14/2020 312* 70 - 99 mg/dL Final   Glucose reference range applies only to samples taken after fasting for at least 8 hours.  . BUN 05/14/2020 33* 6 - 20 mg/dL Final  . Creatinine, Ser 05/14/2020 1.56* 0.61 - 1.24 mg/dL Final  . Calcium 16/10/9602 10.0  8.9 - 10.3 mg/dL Final  . Total Protein 05/14/2020 7.7  6.5 - 8.1 g/dL Final  . Albumin 54/09/8117 4.3  3.5 - 5.0 g/dL Final  . AST 14/78/2956 27  15 - 41 U/L Final  . ALT 05/14/2020 34  0 - 44 U/L Final  . Alkaline Phosphatase 05/14/2020 57  38 - 126 U/L Final  . Total Bilirubin 05/14/2020 0.3  0.3 - 1.2 mg/dL Final  . GFR, Estimated 05/14/2020 51* >60 mL/min Final   Comment: (NOTE) Calculated using the CKD-EPI Creatinine Equation (2021)   . Anion gap 05/14/2020 8  5 - 15 Final   Performed at [facility], [address] [place], [place] [address]  . Alcohol, Ethyl (B) 05/14/2020 <10  <10 mg/dL Final   Comment:  (NOTE) Lowest detectable limit for serum alcohol is 10 mg/dL.  For medical purposes only. Performed at [facility], [address], [place], [place] [address]   . Salicylate Lvl  05/14/2020 <7.0* 7.0 - 30.0 mg/dL Final   Performed at [facility], [address] [place], [place] [address]  . Acetaminophen (Tylenol), Serum 05/14/2020 11  10 - 30 ug/mL Final   Comment: (NOTE) Therapeutic concentrations vary significantly. A range of 10-30 ug/mL  may be an effective concentration for many patients. However, some  are best treated at concentrations outside of this range. Acetaminophen concentrations >150 ug/mL at 4 hours after ingestion  and >50 ug/mL at 12 hours after ingestion are often associated with  toxic reactions.  Performed at [facility], [address] [place], [place] [address]   . WBC 05/14/2020 8.9  4.0 - 10.5 K/uL Final  . RBC 05/14/2020 4.58  4.22 - 5.81 MIL/uL Final  . Hemoglobin 05/14/2020 13.9  13.0 - 17.0 g/dL Final  . HCT 26/83/4196 41.7  39.0 - 52.0 % Final  . MCV 05/14/2020 91.0  80.0 - 100.0 fL Final  . MCH 05/14/2020 30.3  26.0 - 34.0 pg Final  . MCHC 05/14/2020 33.3  30.0 - 36.0 g/dL Final  . RDW 22/29/7989 11.5  11.5 - 15.5 % Final  . Platelets 05/14/2020 336  150 - 400 K/uL Final  . nRBC 05/14/2020 0.0  0.0 - 0.2 % Final   Performed at [facility], [address] [place], [place] [address]  . Opiates 05/14/2020 POSITIVE* NONE DETECTED Final  . Cocaine 05/14/2020 NONE DETECTED  NONE DETECTED Final  . Benzodiazepines 05/14/2020 NONE DETECTED  NONE DETECTED Final  . Amphetamines 05/14/2020 NONE DETECTED  NONE DETECTED Final  . Tetrahydrocannabinol 05/14/2020 NONE DETECTED  NONE DETECTED Final  . Barbiturates 05/14/2020 NONE DETECTED  NONE DETECTED Final   Comment: (NOTE) DRUG SCREEN FOR MEDICAL PURPOSES ONLY.  IF CONFIRMATION IS NEEDED FOR ANY PURPOSE, NOTIFY LAB WITHIN 5  DAYS.  LOWEST DETECTABLE LIMITS FOR URINE DRUG SCREEN Drug Class                     Cutoff (ng/mL) Amphetamine and metabolites    1000 Barbiturate and metabolites    200 Benzodiazepine                 200 Tricyclics and metabolites     300 Opiates and metabolites        300 Cocaine and metabolites        300 THC                            50 Performed at [facility], [address] [place], [place] [address]   . Glucose-Capillary 05/14/2020 280* 70 - 99 mg/dL Final   Glucose reference range applies only to samples taken after fasting for at least 8 hours.  . Comment 1 05/14/2020 Notify RN   Final  . Comment 2 05/14/2020 Document in Chart   Final  . SARS Coronavirus 2 by RT PCR 05/14/2020 NEGATIVE  NEGATIVE Final   Comment: (NOTE) SARS-CoV-2 target nucleic acids are NOT DETECTED.  The SARS-CoV-2 RNA is generally detectable in upper respiratory specimens during the acute phase of infection. The lowest concentration of SARS-CoV-2 viral copies this assay can detect is 138 copies/mL. A negative result does not preclude SARS-Cov-2 infection and should not be used as the sole basis for treatment or other patient management decisions. A negative result may occur with  improper specimen collection/handling, submission of specimen other than nasopharyngeal swab, presence of viral  mutation(s) within the areas targeted by this assay, and inadequate number of viral copies(<138 copies/mL). A negative result must be combined with clinical observations, patient history, and epidemiological information. The expected result is Negative.  Fact Sheet for Patients:  BloggerCourse.com  Fact Sheet for Healthcare Providers:  SeriousBroker.it  This test is no                          t yet approved or cleared by the [place] FDA and  has been authorized for detection and/or diagnosis of SARS-CoV-2 by FDA under an  Emergency Use Authorization (EUA). This EUA will remain  in effect (meaning this test can be used) for the duration of the COVID-19 declaration under Section 564(b)(1) of the Act, 21 U.S.C.section 360bbb-3(b)(1), unless the authorization is terminated  or revoked sooner.      . Influenza A by PCR 05/14/2020 NEGATIVE  NEGATIVE Final  . Influenza B by PCR 05/14/2020 NEGATIVE  NEGATIVE Final   Comment: (NOTE) The Xpert Xpress SARS-CoV-2/FLU/RSV plus assay is intended as an aid in the diagnosis of influenza from Nasopharyngeal swab specimens and should not be used as a sole basis for treatment. Nasal washings and aspirates are unacceptable for Xpert Xpress SARS-CoV-2/FLU/RSV testing.  Fact Sheet for Patients: BloggerCourse.com  Fact Sheet for Healthcare Providers: SeriousBroker.it  This test is not yet approved or cleared by the [place] FDA and has been authorized for detection and/or diagnosis of SARS-CoV-2 by FDA under an Emergency Use Authorization (EUA). This EUA will remain in effect (meaning this test can be used) for the duration of the COVID-19 declaration under Section 564(b)(1) of the Act, 21 U.S.C. section 360bbb-3(b)(1), unless the authorization is terminated or revoked.  Performed at [facility], [address] [place], [place] [address]   Admission on 05/13/2020, Discharged on 05/14/2020  Component Date Value Ref Range Status  . SARS Coronavirus 2 by RT PCR 05/13/2020 NEGATIVE  NEGATIVE Final   Comment: (NOTE) SARS-CoV-2 target nucleic acids are NOT DETECTED.  The SARS-CoV-2 RNA is generally detectable in upper respiratory specimens during the acute phase of infection. The lowest concentration of SARS-CoV-2 viral copies this assay can detect is 138 copies/mL. A negative result does not preclude SARS-Cov-2 infection and should not be used as the sole basis for treatment or other patient  management decisions. A negative result may occur with  improper specimen collection/handling, submission of specimen other than nasopharyngeal swab, presence of viral mutation(s) within the areas targeted by this assay, and inadequate number of viral copies(<138 copies/mL). A negative result must be combined with clinical observations, patient history, and epidemiological information. The expected result is Negative.  Fact Sheet for Patients:  BloggerCourse.com  Fact Sheet for Healthcare Providers:  SeriousBroker.it  This test is no                          t yet approved or cleared by the [place] FDA and  has been authorized for detection and/or diagnosis of SARS-CoV-2 by FDA under an Emergency Use Authorization (EUA). This EUA will remain  in effect (meaning this test can be used) for the duration of the COVID-19 declaration under Section 564(b)(1) of the Act, 21 U.S.C.section 360bbb-3(b)(1), unless the authorization is terminated  or revoked sooner.      . Influenza A by PCR 05/13/2020 NEGATIVE  NEGATIVE Final  . Influenza B by PCR 05/13/2020 NEGATIVE  NEGATIVE  Final   Comment: (NOTE) The Xpert Xpress SARS-CoV-2/FLU/RSV plus assay is intended as an aid in the diagnosis of influenza from Nasopharyngeal swab specimens and should not be used as a sole basis for treatment. Nasal washings and aspirates are unacceptable for Xpert Xpress SARS-CoV-2/FLU/RSV testing.  Fact Sheet for Patients: BloggerCourse.com  Fact Sheet for Healthcare Providers: SeriousBroker.it  This test is not yet approved or cleared by the [place] FDA and has been authorized for detection and/or diagnosis of SARS-CoV-2 by FDA under an Emergency Use Authorization (EUA). This EUA will remain in effect (meaning this test can be used) for the duration of the COVID-19 declaration under Section  564(b)(1) of the Act, 21 U.S.C. section 360bbb-3(b)(1), unless the authorization is terminated or revoked.  Performed at [facility], [address] [place], [place] [address]   . WBC 05/13/2020 9.3  4.0 - 10.5 K/uL Final  . RBC 05/13/2020 5.22  4.22 - 5.81 MIL/uL Final  . Hemoglobin 05/13/2020 15.6  13.0 - 17.0 g/dL Final  . HCT 44/92/0100 47.4  39.0 - 52.0 % Final  . MCV 05/13/2020 90.8  80.0 - 100.0 fL Final  . MCH 05/13/2020 29.9  26.0 - 34.0 pg Final  . MCHC 05/13/2020 32.9  30.0 - 36.0 g/dL Final  . RDW 71/21/9758 11.4* 11.5 - 15.5 % Final  . Platelets 05/13/2020 378  150 - 400 K/uL Final  . nRBC 05/13/2020 0.0  0.0 - 0.2 % Final  . Neutrophils Relative % 05/13/2020 72  % Final  . Neutro Abs 05/13/2020 6.6  1.7 - 7.7 K/uL Final  . Lymphocytes Relative 05/13/2020 22  % Final  . Lymphs Abs 05/13/2020 2.1  0.7 - 4.0 K/uL Final  . Monocytes Relative 05/13/2020 5  % Final  . Monocytes Absolute 05/13/2020 0.5  0.1 - 1.0 K/uL Final  . Eosinophils Relative 05/13/2020 1  % Final  . Eosinophils Absolute 05/13/2020 0.1  0.0 - 0.5 K/uL Final  . Basophils Relative 05/13/2020 0  % Final  . Basophils Absolute 05/13/2020 0.0  0.0 - 0.1 K/uL Final  . Immature Granulocytes 05/13/2020 0  % Final  . Abs Immature Granulocytes 05/13/2020 0.03  0.00 - 0.07 K/uL Final   Performed at [facility], [address] [place], [place] [address]  . Sodium 05/13/2020 137  135 - 145 mmol/L Final  . Potassium 05/13/2020 3.9  3.5 - 5.1 mmol/L Final  . Chloride 05/13/2020 97* 98 - 111 mmol/L Final  . CO2 05/13/2020 29  22 - 32 mmol/L Final  . Glucose, Bld 05/13/2020 153* 70 - 99 mg/dL Final   Glucose reference range applies only to samples taken after fasting for at least 8 hours.  . BUN 05/13/2020 24* 6 - 20 mg/dL Final  . Creatinine, Ser 05/13/2020 1.35* 0.61 - 1.24 mg/dL Final  . Calcium 98/26/4158 10.3  8.9 - 10.3 mg/dL Final  . Total Protein 05/13/2020 8.1  6.5 - 8.1 g/dL Final  .  Albumin 30/94/0768 4.9  3.5 - 5.0 g/dL Final  . AST 08/81/1031 28  15 - 41 U/L Final  . ALT 05/13/2020 49* 0 - 44 U/L Final  . Alkaline Phosphatase 05/13/2020 61  38 - 126 U/L Final  . Total Bilirubin 05/13/2020 0.8  0.3 - 1.2 mg/dL Final  . GFR, Estimated 05/13/2020 >60  >60 mL/min Final   Comment: (NOTE) Calculated using the CKD-EPI Creatinine Equation (2021)   . Anion gap 05/13/2020 11  5 - 15  Final   Performed at [facility], [address] [place], [place] [address]  . Hgb A1c MFr Bld 05/13/2020 7.2* 4.8 - 5.6 % Final   Comment: (NOTE) Pre diabetes:          5.7%-6.4%  Diabetes:              >6.4%  Glycemic control for   <7.0% adults with diabetes   . Mean Plasma Glucose 05/13/2020 159.94  mg/dL Final   Performed at [facility], [address], [place], [place] [address]  . Magnesium 05/13/2020 2.3  1.7 - 2.4 mg/dL Final   Performed at [facility], [address], [place], [place] [address]  . Alcohol, Ethyl (B) 05/13/2020 <10  <10 mg/dL Final   Comment: (NOTE) Lowest detectable limit for serum alcohol is 10 mg/dL.  For medical purposes only. Performed at [facility], [address] [place], [place] [address]   . TSH 05/13/2020 1.528  0.350 - 4.500 uIU/mL Final   Comment: Performed by a 3rd Generation assay with a functional sensitivity of <=0.01 uIU/mL. Performed at [facility], [address], [place], [place] [address]   . POC Amphetamine UR 05/13/2020 None Detected  NONE DETECTED (Cut Off Level 1000 ng/mL) Final  . POC Secobarbital (BAR) 05/13/2020 None Detected  NONE DETECTED (Cut Off Level 300 ng/mL) Final  . POC Buprenorphine (BUP) 05/13/2020 None Detected  NONE DETECTED (Cut Off Level 10 ng/mL) Final  . POC Oxazepam (BZO) 05/13/2020 Positive* NONE DETECTED (Cut Off Level 300 ng/mL) Final  . POC Cocaine UR 05/13/2020 None Detected  NONE DETECTED (Cut Off Level 300 ng/mL) Final  . POC Methamphetamine UR 05/13/2020 None Detected   NONE DETECTED (Cut Off Level 1000 ng/mL) Final  . POC Morphine 05/13/2020 None Detected  NONE DETECTED (Cut Off Level 300 ng/mL) Final  . POC Oxycodone UR 05/13/2020 None Detected  NONE DETECTED (Cut Off Level 100 ng/mL) Final  . POC Methadone UR 05/13/2020 None Detected  NONE DETECTED (Cut Off Level 300 ng/mL) Final  . POC Marijuana UR 05/13/2020 None Detected  NONE DETECTED (Cut Off Level 50 ng/mL) Final  . SARS Coronavirus 2 Ag 05/13/2020 Negative  Negative Preliminary  . SARSCOV2ONAVIRUS 2 AG 05/13/2020 NEGATIVE  NEGATIVE Final   Comment: (NOTE) SARS-CoV-2 antigen NOT DETECTED.   Negative results are presumptive.  Negative results do not preclude SARS-CoV-2 infection and should not be used as the sole basis for treatment or other patient management decisions, including infection  control decisions, particularly in the presence of clinical signs and  symptoms consistent with COVID-19, or in those who have been in contact with the virus.  Negative results must be combined with clinical observations, patient history, and epidemiological information. The expected result is Negative.  Fact Sheet for Patients: https://www.jennings-kim.com/  Fact Sheet for Healthcare Providers: https://alexander-rogers.biz/  This test is not yet approved or cleared by the [place] FDA and  has been authorized for detection and/or diagnosis of SARS-CoV-2 by FDA under an Emergency Use Authorization (EUA).  This EUA will remain in effect (meaning this test can be used) for the duration of  the COV                          ID-19 declaration under Section 564(b)(1) of the Act, 21 U.S.C. section 360bbb-3(b)(1), unless the authorization is terminated or revoked sooner.    . Cholesterol 05/13/2020 167  0 - 200 mg/dL Final  . Triglycerides 05/13/2020 90  <150 mg/dL Final  . HDL 33/82/5053 57  >40 mg/dL Final  . Total CHOL/HDL Ratio 05/13/2020 2.9  RATIO Final  . VLDL 05/13/2020  18  0 - 40 mg/dL Final  . LDL Cholesterol 05/13/2020 92  0 - 99 mg/dL Final   Comment:        Total Cholesterol/HDL:CHD Risk Coronary Heart Disease Risk Table                     Men   Women  1/2 Average Risk   3.4   3.3  Average Risk       5.0   4.4  2 X Average Risk   9.6   7.1  3 X Average Risk  23.4   11.0        Use the calculated Patient Ratio above and the CHD Risk Table to determine the patient's CHD Risk.        ATP III CLASSIFICATION (LDL):  <100     mg/dL   Optimal  976-734  mg/dL   Near or Above                    Optimal  130-159  mg/dL   Borderline  193-790  mg/dL   High  >240     mg/dL   Very High Performed at [facility], [address], [place], [place] [address]     Blood Alcohol level:  Lab Results  Component Value Date   ETH <10 05/14/2020   ETH <10 05/13/2020    Metabolic Disorder Labs: Lab Results  Component Value Date   HGBA1C 7.2 (H) 05/13/2020   MPG 159.94 05/13/2020   No results found for: PROLACTIN Lab Results  Component Value Date   CHOL 167 05/13/2020   TRIG 90 05/13/2020   HDL 57 05/13/2020   CHOLHDL 2.9 05/13/2020   VLDL 18 05/13/2020   LDLCALC 92 05/13/2020   LDLCALC 148 (H) 10/06/2009    Therapeutic Lab Levels: No results found for: LITHIUM No results found for: VALPROATE No components found for:  CBMZ  Physical Findings   Flowsheet Row ED from 05/15/2020 in [facility] ED from 05/14/2020 in [facility] from 05/13/2020 in [facility]  C-SSRS RISK CATEGORY Error: Q6 is Yes, you must answer 7 High Risk High Risk       Musculoskeletal  Strength & Muscle Tone: within normal limits Gait & Station: normal Patient leans: N/A  Psychiatric Specialty Exam  Presentation  General Appearance: Casual  Eye Contact:Good  Speech:Clear and Coherent  Speech Volume:Normal  Handedness:Right   Mood and Affect   Mood:Euthymic  Affect:Congruent   Thought Process  Thought Processes:Coherent  Descriptions of Associations:Intact  Orientation:Full (Time, Place and Person)  Thought Content:Logical  Diagnosis of Schizophrenia or Schizoaffective disorder in past: No    Hallucinations:Hallucinations: None  Ideas of Reference:None  Suicidal Thoughts:Suicidal Thoughts: No  Homicidal Thoughts:Homicidal Thoughts: No   Sensorium  Memory:Immediate Good; Recent Good; Remote Good  Judgment:Fair  Insight:Shallow   Executive Functions  Concentration:Good  Attention Span:Good  Recall:Fair  Fund of Knowledge:Good  Language:Good   Psychomotor Activity  Psychomotor Activity:Psychomotor Activity: Normal   Assets  Assets:Desire for Improvement; Resilience   Sleep  Sleep:Sleep: Fair   Nutritional Assessment (For OBS and FBC admissions only) Has the patient had a weight loss or gain of  10 pounds or more in the last 3 months?: Yes Has the patient had a decrease in food intake/or appetite?: Yes Does the patient have dental problems?: No Does the patient have eating habits or behaviors that may be indicators of an eating disorder including binging or inducing vomiting?: No Has the patient recently lost weight without trying?: No Has the patient been eating poorly because of a decreased appetite?: No Malnutrition Screening Tool Score: 0    Physical Exam  Physical Exam Constitutional:      Appearance: Normal appearance.  HENT:     Head: Normocephalic and atraumatic.     Nose: Nose normal.  Eyes:     Extraocular Movements: Extraocular movements intact.     Pupils: Pupils are equal, round, and reactive to light.  Cardiovascular:     Rate and Rhythm: Normal rate.     Pulses: Normal pulses.  Pulmonary:     Effort: Pulmonary effort is normal.  Musculoskeletal:        General: Normal range of motion.  Skin:    General: Skin is warm and dry.  Neurological:     General: No  focal deficit present.     Mental Status: He is alert.    Review of Systems  Constitutional: Negative for chills and fever.  HENT: Negative for hearing loss.   Eyes: Negative for blurred vision.  Respiratory: Negative for cough and wheezing.   Cardiovascular: Negative for chest pain.  Gastrointestinal: Negative for abdominal pain.  Neurological: Negative for dizziness.  Psychiatric/Behavioral: Negative for suicidal ideas.   Blood pressure (!) 129/92, pulse 79, temperature 98.7 F (37.1 C), temperature source Oral, resp. rate 18, SpO2 100 %. There is no height or weight on file to calculate BMI.  Treatment Plan Summary: Daily contact with patient to assess and evaluate symptoms and progress in treatment MDD PTSD - Continue patient Prozac 20mg . Patient may need increase but he was just started on the medication Friday.  PGY-1 Bobbye MortonJai B Caison Hearn, MD 05/15/2020 12:37 PM

## 2020-05-15 NOTE — ED Notes (Signed)
Chef salad given per request for lunch

## 2020-05-15 NOTE — ED Notes (Signed)
Pt sleeping at present, no distress noted.  Monitoring for safety. 

## 2020-05-15 NOTE — ED Notes (Signed)
Resting with eyes closed. Rise and fall of chest noted. No new issues at this time. Will continue to monitor for safety 
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
## 2020-05-16 ENCOUNTER — Encounter (HOSPITAL_COMMUNITY): Payer: Self-pay | Admitting: Psychiatry

## 2020-05-16 ENCOUNTER — Ambulatory Visit (INDEPENDENT_AMBULATORY_CARE_PROVIDER_SITE_OTHER): Payer: Medicare Other | Admitting: Psychiatry

## 2020-05-16 VITALS — BP 108/84 | HR 108 | Ht 68.0 in | Wt 188.0 lb

## 2020-05-16 DIAGNOSIS — R45851 Suicidal ideations: Secondary | ICD-10-CM | POA: Diagnosis present

## 2020-05-16 DIAGNOSIS — F332 Major depressive disorder, recurrent severe without psychotic features: Secondary | ICD-10-CM

## 2020-05-16 DIAGNOSIS — F4310 Post-traumatic stress disorder, unspecified: Secondary | ICD-10-CM

## 2020-05-16 DIAGNOSIS — F331 Major depressive disorder, recurrent, moderate: Secondary | ICD-10-CM | POA: Insufficient documentation

## 2020-05-16 DIAGNOSIS — F411 Generalized anxiety disorder: Secondary | ICD-10-CM

## 2020-05-16 DIAGNOSIS — Z79899 Other long term (current) drug therapy: Secondary | ICD-10-CM | POA: Diagnosis not present

## 2020-05-16 MED ORDER — HYDROXYZINE HCL 10 MG PO TABS
10.0000 mg | ORAL_TABLET | Freq: Three times a day (TID) | ORAL | 2 refills | Status: DC | PRN
Start: 2020-05-16 — End: 2020-08-16

## 2020-05-16 MED ORDER — FLUOXETINE HCL 20 MG PO CAPS
20.0000 mg | ORAL_CAPSULE | Freq: Every day | ORAL | 2 refills | Status: DC
Start: 2020-05-16 — End: 2020-08-16

## 2020-05-16 NOTE — ED Notes (Signed)
Pt sleeping at present, no distress noted, monitoring for safety. 

## 2020-05-16 NOTE — ED Notes (Signed)
Ambulated per self to retrieve belongings. No s/s pain, discomfort, or acute distress. No c/o SI, HI, or AVH. Educated on d/c instructions and f/u care. Verbalized understanding. Escorted to front lobby and shown elevators to take to get to OP therapy. Stable at time of d/c
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
## 2020-05-16 NOTE — BH Assessment (Signed)
Care Management - Follow Up [facility] Discharges   Writer attempted to make contact with patient today and was unsuccessful.  Writer was able to leave a HIPPA compliant voice message and will await callback.  Per chart review, patient followed up with Toy Cookey, NP for medication management.
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
## 2020-05-17 LAB — PROLACTIN: Prolactin: UNDETERMINED ng/mL

## 2020-07-11 ENCOUNTER — Ambulatory Visit (HOSPITAL_COMMUNITY): Payer: Medicaid Other | Admitting: Clinical

## 2020-08-16 ENCOUNTER — Encounter (HOSPITAL_COMMUNITY): Payer: Self-pay | Admitting: Psychiatry

## 2020-08-16 ENCOUNTER — Other Ambulatory Visit: Payer: Self-pay

## 2020-08-16 ENCOUNTER — Ambulatory Visit (INDEPENDENT_AMBULATORY_CARE_PROVIDER_SITE_OTHER): Payer: Medicare HMO | Admitting: Psychiatry

## 2020-08-16 DIAGNOSIS — F411 Generalized anxiety disorder: Secondary | ICD-10-CM | POA: Diagnosis not present

## 2020-08-16 DIAGNOSIS — F331 Major depressive disorder, recurrent, moderate: Secondary | ICD-10-CM

## 2020-08-16 MED ORDER — HYDROXYZINE HCL 10 MG PO TABS: 10.0000 mg | ORAL_TABLET | Freq: Three times a day (TID) | ORAL | 2 refills | Status: DC | PRN

## 2020-08-16 MED ORDER — FLUOXETINE HCL 40 MG PO CAPS: 40.0000 mg | ORAL_CAPSULE | Freq: Every day | ORAL | 2 refills | Status: DC
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
## 2020-08-30 ENCOUNTER — Ambulatory Visit (HOSPITAL_COMMUNITY): Payer: Medicare Other | Admitting: Licensed Clinical Social Worker

## 2020-09-12 ENCOUNTER — Other Ambulatory Visit: Payer: Self-pay

## 2020-09-12 ENCOUNTER — Ambulatory Visit (INDEPENDENT_AMBULATORY_CARE_PROVIDER_SITE_OTHER): Payer: Medicare HMO | Admitting: Clinical

## 2020-09-12 DIAGNOSIS — F331 Major depressive disorder, recurrent, moderate: Secondary | ICD-10-CM
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
## 2020-10-13 ENCOUNTER — Ambulatory Visit (INDEPENDENT_AMBULATORY_CARE_PROVIDER_SITE_OTHER): Payer: Medicare HMO | Admitting: Clinical

## 2020-10-13 ENCOUNTER — Other Ambulatory Visit: Payer: Self-pay

## 2020-10-13 DIAGNOSIS — F331 Major depressive disorder, recurrent, moderate: Secondary | ICD-10-CM

## 2020-10-24 NOTE — Progress Notes (Signed)
   THERAPIST PROGRESS NOTE  Session Time: 45 minutes  Participation Level: Active  Behavioral Response: CasualAlertEuthymic  Type of Therapy: Individual Therapy  Treatment Goals addressed: Coping  Interventions: CBT and Supportive  Summary:  Lance Chapman is a 55 y.o. male who presents for the scheduled session oriented x5, appropriately dressed, and friendly. Client denied hallucinations and delusions. Client reported on today he has been working through negative emotions regarding his families comments about his care of his mother. Client reported as stated previously his mother has dementia. Client reported his mother recently took a shower by herself which he was not aware of until after she'd done it. Client reported this occurred before their new in home aide started working. Client reported his mother spoke with his siblings and unknowingly told them what she'd done. Client reported his brother fussed him out. Client reported ultimately he did receive an apology because their judgements came without the understanding of his day to day dealings of caring for her. Client reported his older brother has always been hostile towards him and his mother siblings have noticed. Client reported he has been working on letting things go. Client reported he spoke with one of his brothers about being the sibling who does not have a family such as kids to get up and go like his other siblings. Client reported he talked with his brother about coming to an understanding there will be a day when he may want to relieve himself from being the primary care giver. Client reported he has a girlfriend and is looking positively towards his future. Client reported he still has his days of depression and anxiety that he is working through.    Suicidal/Homicidal: Nowithout intent/plan  Therapist Response:  Therapist began the session asking the client how he has been doing since last seen. Therapist used CBT to  utilize active listening and positive emotional support towards the clients thoughts and feelings. Therapist used CBT to engage with the client to ask him to identify the stressor that impacted his mood. Therapist used CBT to engage and normalized clients emotions within normal range. Therapist assigned the client homework to practice boundaries. Client was scheduled for next appt.     Plan: Return again in 6 weeks.  Diagnosis: Moderate episode of recurrent major depressive disorder   Neena Rhymes Chastidy Ranker, LCSW 10/13/2020

## 2020-11-17 ENCOUNTER — Ambulatory Visit (INDEPENDENT_AMBULATORY_CARE_PROVIDER_SITE_OTHER): Payer: Medicare HMO | Admitting: Psychiatry

## 2020-11-17 ENCOUNTER — Ambulatory Visit (HOSPITAL_COMMUNITY)
Admission: EM | Admit: 2020-11-17 | Discharge: 2020-11-18 | Disposition: A | Discharge status: Home / Self Care | Payer: Medicare HMO | Attending: Family | Admitting: Family

## 2020-11-17 ENCOUNTER — Other Ambulatory Visit: Payer: Self-pay

## 2020-11-17 ENCOUNTER — Encounter (HOSPITAL_COMMUNITY): Payer: Self-pay | Admitting: Psychiatry

## 2020-11-17 VITALS — BP 137/100 | HR 83 | Ht 69.0 in | Wt 198.0 lb

## 2020-11-17 DIAGNOSIS — R45851 Suicidal ideations: Secondary | ICD-10-CM | POA: Insufficient documentation

## 2020-11-17 DIAGNOSIS — F332 Major depressive disorder, recurrent severe without psychotic features: Secondary | ICD-10-CM | POA: Diagnosis present

## 2020-11-17 DIAGNOSIS — F411 Generalized anxiety disorder: Secondary | ICD-10-CM | POA: Diagnosis not present

## 2020-11-17 DIAGNOSIS — Z7984 Long term (current) use of oral hypoglycemic drugs: Secondary | ICD-10-CM | POA: Insufficient documentation

## 2020-11-17 DIAGNOSIS — Z636 Dependent relative needing care at home: Secondary | ICD-10-CM | POA: Diagnosis not present

## 2020-11-17 DIAGNOSIS — Z20822 Contact with and (suspected) exposure to covid-19: Secondary | ICD-10-CM | POA: Insufficient documentation

## 2020-11-17 DIAGNOSIS — Z733 Stress, not elsewhere classified: Secondary | ICD-10-CM | POA: Insufficient documentation

## 2020-11-17 DIAGNOSIS — Z79899 Other long term (current) drug therapy: Secondary | ICD-10-CM | POA: Diagnosis not present

## 2020-11-17 DIAGNOSIS — Z7982 Long term (current) use of aspirin: Secondary | ICD-10-CM | POA: Diagnosis not present

## 2020-11-17 DIAGNOSIS — Z599 Problem related to housing and economic circumstances, unspecified: Secondary | ICD-10-CM | POA: Insufficient documentation

## 2020-11-17 LAB — COMPREHENSIVE METABOLIC PANEL
ALT: 32 U/L (ref 0–44)
AST: 19 U/L (ref 15–41)
Albumin: 4.7 g/dL (ref 3.5–5.0)
Alkaline Phosphatase: 79 U/L (ref 38–126)
Anion gap: 9 (ref 5–15)
BUN: 15 mg/dL (ref 6–20)
CO2: 27 mmol/L (ref 22–32)
Calcium: 10.1 mg/dL (ref 8.9–10.3)
Chloride: 97 mmol/L — ABNORMAL LOW (ref 98–111)
Creatinine, Ser: 1.42 mg/dL — ABNORMAL HIGH (ref 0.61–1.24)
GFR, Estimated: 58 mL/min — ABNORMAL LOW (ref >60)
Glucose, Bld: 176 mg/dL — ABNORMAL HIGH (ref 70–99)
Potassium: 4.2 mmol/L (ref 3.5–5.1)
Sodium: 133 mmol/L — ABNORMAL LOW (ref 135–145)
Total Bilirubin: 0.6 mg/dL (ref 0.3–1.2)
Total Protein: 8.4 g/dL — ABNORMAL HIGH (ref 6.5–8.1)

## 2020-11-17 LAB — CBC WITH DIFFERENTIAL/PLATELET
Abs Immature Granulocytes: 0.02 10*3/uL (ref 0.00–0.07)
Basophils Absolute: 0 10*3/uL (ref 0.0–0.1)
Basophils Relative: 0 %
Eosinophils Absolute: 0.1 10*3/uL (ref 0.0–0.5)
Eosinophils Relative: 1 %
HCT: 52 % (ref 39.0–52.0)
Hemoglobin: 17 g/dL (ref 13.0–17.0)
Immature Granulocytes: 0 %
Lymphocytes Relative: 20 %
Lymphs Abs: 1.5 10*3/uL (ref 0.7–4.0)
MCH: 29.5 pg (ref 26.0–34.0)
MCHC: 32.7 g/dL (ref 30.0–36.0)
MCV: 90.1 fL (ref 80.0–100.0)
Monocytes Absolute: 0.3 10*3/uL (ref 0.1–1.0)
Monocytes Relative: 4 %
Neutro Abs: 5.8 10*3/uL (ref 1.7–7.7)
Neutrophils Relative %: 75 %
Platelets: 334 10*3/uL (ref 150–400)
RBC: 5.77 MIL/uL (ref 4.22–5.81)
RDW: 11.5 % (ref 11.5–15.5)
WBC: 7.8 10*3/uL (ref 4.0–10.5)
nRBC: 0 % (ref 0.0–0.2)

## 2020-11-17 LAB — LIPID PANEL
Cholesterol: 236 mg/dL — ABNORMAL HIGH (ref 0–200)
HDL: 60 mg/dL (ref >40)
LDL Cholesterol: 153 mg/dL — ABNORMAL HIGH (ref 0–99)
Total CHOL/HDL Ratio: 3.9 RATIO
Triglycerides: 116 mg/dL (ref <150)
VLDL: 23 mg/dL (ref 0–40)

## 2020-11-17 LAB — HEMOGLOBIN A1C
Hgb A1c MFr Bld: 8 % — ABNORMAL HIGH (ref 4.8–5.6)
Mean Plasma Glucose: 182.9 mg/dL

## 2020-11-17 LAB — POCT URINE DRUG SCREEN - MANUAL ENTRY (I-SCREEN)
POC Amphetamine UR: NOT DETECTED
POC Buprenorphine (BUP): NOT DETECTED
POC Cocaine UR: NOT DETECTED
POC Marijuana UR: NOT DETECTED
POC Methadone UR: NOT DETECTED
POC Methamphetamine UR: NOT DETECTED
POC Morphine: NOT DETECTED
POC Oxazepam (BZO): NOT DETECTED
POC Oxycodone UR: NOT DETECTED
POC Secobarbital (BAR): NOT DETECTED

## 2020-11-17 LAB — TSH: TSH: 0.764 u[IU]/mL (ref 0.350–4.500)

## 2020-11-17 LAB — RESP PANEL BY RT-PCR (FLU A&B, COVID) ARPGX2
Influenza A by PCR: NEGATIVE
Influenza B by PCR: NEGATIVE
SARS Coronavirus 2 by RT PCR: NEGATIVE

## 2020-11-17 LAB — MAGNESIUM: Magnesium: 2.5 mg/dL — ABNORMAL HIGH (ref 1.7–2.4)

## 2020-11-17 LAB — POC SARS CORONAVIRUS 2 AG -  ED: SARS Coronavirus 2 Ag: NEGATIVE

## 2020-11-17 MED ORDER — TIZANIDINE HCL 2 MG PO TABS: 2.0000 mg | ORAL_TABLET | Freq: Three times a day (TID) | ORAL | Status: DC | PRN

## 2020-11-17 MED ORDER — LISINOPRIL 20 MG PO TABS
20.0000 mg | ORAL_TABLET | Freq: Every day | ORAL | Status: DC
  Administered 2020-11-18: 20 mg via ORAL
  Filled 2020-11-17: qty 1

## 2020-11-17 MED ORDER — EMPAGLIFLOZIN 10 MG PO TABS
10.0000 mg | ORAL_TABLET | Freq: Every day | ORAL | Status: DC
  Administered 2020-11-18: 10 mg via ORAL
  Filled 2020-11-17: qty 1

## 2020-11-17 MED ORDER — HYDROXYZINE HCL 25 MG PO TABS: 25.0000 mg | ORAL_TABLET | Freq: Three times a day (TID) | ORAL | Status: DC | PRN

## 2020-11-17 MED ORDER — MAGNESIUM HYDROXIDE 400 MG/5ML PO SUSP: 30.0000 mL | Freq: Every day | ORAL | Status: DC | PRN

## 2020-11-17 MED ORDER — HYDROCHLOROTHIAZIDE 12.5 MG PO TABS
12.5000 mg | ORAL_TABLET | Freq: Every day | ORAL | Status: DC
  Administered 2020-11-18: 12.5 mg via ORAL
  Filled 2020-11-17: qty 1

## 2020-11-17 MED ORDER — FLUOXETINE HCL 20 MG PO CAPS
40.0000 mg | ORAL_CAPSULE | Freq: Every day | ORAL | Status: DC
  Administered 2020-11-18: 40 mg via ORAL
  Filled 2020-11-17: qty 2

## 2020-11-17 MED ORDER — LINAGLIPTIN 5 MG PO TABS: 5.0000 mg | ORAL_TABLET | Freq: Every day | ORAL | Status: DC

## 2020-11-17 MED ORDER — ATORVASTATIN CALCIUM 10 MG PO TABS
20.0000 mg | ORAL_TABLET | Freq: Every day | ORAL | Status: DC
  Administered 2020-11-18: 20 mg via ORAL
  Filled 2020-11-17: qty 2

## 2020-11-17 MED ORDER — LISINOPRIL-HYDROCHLOROTHIAZIDE 20-12.5 MG PO TABS: 1.0000 | ORAL_TABLET | Freq: Every day | ORAL | Status: DC

## 2020-11-17 MED ORDER — ASPIRIN EC 81 MG PO TBEC
81.0000 mg | DELAYED_RELEASE_TABLET | Freq: Every day | ORAL | Status: DC
  Administered 2020-11-18: 81 mg via ORAL
  Filled 2020-11-17: qty 1

## 2020-11-17 MED ORDER — TRAZODONE HCL 50 MG PO TABS: 50.0000 mg | ORAL_TABLET | Freq: Every evening | ORAL | Status: DC | PRN

## 2020-11-17 MED ORDER — ACETAMINOPHEN 325 MG PO TABS: 650.0000 mg | ORAL_TABLET | Freq: Four times a day (QID) | ORAL | Status: DC | PRN

## 2020-11-17 MED ORDER — ALUM & MAG HYDROXIDE-SIMETH 200-200-20 MG/5ML PO SUSP: 30.0000 mL | ORAL | Status: DC | PRN

## 2020-11-17 NOTE — Progress Notes (Signed)
BH MD/PA/NP OP Progress Note  11/17/2020 1:16 PM Lance Chapman  MRN:  [id number]  Chief Complaint: "I have a lot going on" Chief Complaint   Medication Management    HPI: 55 year old male seen today for follow up psychiatric evaluation. He has a psychiatric history of anxiety, depression, SI, and PTSD. He is currently managed on Prozac 40 mg daily and hydroxyzine 10 mg three times daily as needed. He notes his medications are somewhat effective in managing his psychiatric conditions.   Today he is well-groomed, pleasant, cooperative, engaged in conversation, and maintains eye contact.  He notes that since his last visit he has had an increase in life stressors. He reports his GI issues have improved but he is currently being seen by his PCP for kidney disease and hypertension. He informed provider that he continues to live with his 34 year old mother. Her physical health has improved but her dementia has worsened. The family is still considering an assisted living facility. He reports that the entire family, including kids and grandkids, are planning a large Thanksgiving together and while he is excited for that, it has also caused financial stress. He also informed provider that [place]   Patient notes that at times he has difficulty falling asleep.  He notes that he sleeps between 3 and 5 hours.  Provider initially suggested trazodone however patient notes that he had a plan to harm himself by overdosing on sleeping pills.  Patient then walked down to [facility] for further evaluation.  Patient notes that the above is stressors are overwhelming.  Provider today provider conducted a GAD-7 and patient scored a 17, at his last visit he scored a 16.  Provider also conducted a PHQ-9 and patient scored a 21, at his last visit he scored a 19.  He notes that the increase in his anxiety and depression are due to life stressors such as  financial hardship due to inflation. Today he denies HI/AH/VH, mania, or paranoia. He endorses adequate appetite.   Provider spoke to patient's brother Fraser Din [phone].  Who initially informed writer that he felt patient could be safe coming home however then after conversation with his sisters reports that he feels that he is a danger to himself.  Fraser Din and his sister notes that Mr. Parley has had increased depression, poor sleep, and anxiety.  He notes that they are not worried about his mental stability and is grateful that he will be seen for further evaluation.  At this time medications not adjusted.  Patient does have a therapy appointment on 12/06/2020.  He will follow-up with provider in the near future for further evaluation and medication management.    ICD-10-CM   1. Suicide ideation  R45.851     2. Generalized anxiety disorder  F41.1     3. Severe episode of recurrent major depressive disorder, without psychotic features (HCC)  F33.2       Past Psychiatric History: Depression, anxiety, SI, PTSD  Past Medical History:  Past Medical History:  Diagnosis Date   Anxiety    Back pain, chronic    Depression    Hypertension    UTI (lower urinary tract infection)    No past surgical history on file.  Family Psychiatric History: Mother depression  Family History: No family history on file.  Social History:  Social History   Socioeconomic History   Marital status: Single    Spouse name: Not on  file   Number of children: Not on file   Years of education: Not on file   Highest education level: Not on file  Occupational History   Not on file  Tobacco Use   Smoking status: Never   Smokeless tobacco: Never  Substance and Sexual Activity   Alcohol use: No   Drug use: No   Sexual activity: Not on file  Other Topics Concern   Not on file  Social History Narrative   Not on file   Social Determinants of Health   Financial Resource Strain: Not on file  Food  Insecurity: Not on file  Transportation Needs: Not on file  Physical Activity: Not on file  Stress: Not on file  Social Connections: Not on file    Allergies:  Allergies  Allergen Reactions   Bee Venom Anaphylaxis   Benadryl Allergy [Diphenhydramine] Anaphylaxis    Metabolic Disorder Labs: Lab Results  Component Value Date   HGBA1C 7.2 (H) 05/13/2020   MPG 159.94 05/13/2020   Lab Results  Component Value Date   PROLACTIN QUANTITY NOT SUFFICIENT, UNABLE TO PERFORM TEST 05/13/2020   Lab Results  Component Value Date   CHOL 167 05/13/2020   TRIG 90 05/13/2020   HDL 57 05/13/2020   CHOLHDL 2.9 05/13/2020   VLDL 18 05/13/2020   LDLCALC 92 05/13/2020   LDLCALC 148 (H) 10/06/2009   Lab Results  Component Value Date   TSH 1.528 05/13/2020    Therapeutic Level Labs: No results found for: LITHIUM No results found for: VALPROATE No components found for:  CBMZ  Current Medications: No current facility-administered medications for this visit.   Current Outpatient Medications  Medication Sig Dispense Refill   atorvastatin (LIPITOR) 20 MG tablet Take 20 mg by mouth daily.     FLUoxetine (PROZAC) 40 MG capsule Take 1 capsule (40 mg total) by mouth daily. 30 capsule 2   hydrOXYzine (ATARAX/VISTARIL) 10 MG tablet Take 1 tablet (10 mg total) by mouth 3 (three) times daily as needed. 90 tablet 2   JARDIANCE 10 MG TABS tablet Take 10 mg by mouth daily.     lisinopril-hydrochlorothiazide (PRINZIDE,ZESTORETIC) 20-25 MG per tablet Take 1 tablet by mouth daily.     OZEMPIC, 0.25 OR 0.5 MG/DOSE, 2 MG/1.5ML SOPN SMARTSIG:0.3 Milliliter(s) SUB-Q Once a Week     sitaGLIPtin (JANUVIA) 100 MG tablet Take 100 mg by mouth daily.     TRESIBA FLEXTOUCH 100 UNIT/ML FlexTouch Pen Inject 20 Units into the skin daily.     aspirin 81 MG EC tablet Take 81 mg by mouth daily.     EPINEPHrine (EPIPEN 2-PAK) 0.3 mg/0.3 mL IJ SOAJ injection      tiZANidine (ZANAFLEX) 2 MG tablet Take 2 mg by mouth 3  (three) times daily as needed.     Facility-Administered Medications Ordered in Other Visits  Medication Dose Route Frequency Provider Last Rate Last Admin   acetaminophen (TYLENOL) tablet 650 mg  650 mg Oral Q6H PRN Lenard Lance, FNP       alum & mag hydroxide-simeth (MAALOX/MYLANTA) 200-200-20 MG/5ML suspension 30 mL  30 mL Oral Q4H PRN Lenard Lance, FNP       hydrOXYzine (ATARAX/VISTARIL) tablet 25 mg  25 mg Oral TID PRN Lenard Lance, FNP       magnesium hydroxide (MILK OF MAGNESIA) suspension 30 mL  30 mL Oral Daily PRN Lenard Lance, FNP       traZODone (DESYREL) tablet 50 mg  50 mg  Oral QHS PRN Lenard Lance, FNP         Musculoskeletal: Strength & Muscle Tone: within normal limits Gait & Station: normal Patient leans: N/A  Psychiatric Specialty Exam: Review of Systems  Blood pressure (!) 137/100, pulse 83, height 5\' 9"  (1.753 m), weight 198 lb (89.8 kg).Body mass index is 29.24 kg/m.  General Appearance: Well Groomed  Eye Contact:  Good  Speech:  Clear and Coherent and Normal Rate  Volume:  Normal  Mood:  Anxious and Depressed  Affect:  Appropriate and Congruent  Thought Process:  Coherent, Goal Directed, and Linear  Orientation:  Full (Time, Place, and Person)  Thought Content: WDL and Logical   Suicidal Thoughts:  Yes.  with intent/plan  Homicidal Thoughts:  No  Memory:  Immediate;   Good Recent;   Good Remote;   Good  Judgement:  Fair  Insight:  Fair  Psychomotor Activity:  Normal  Concentration:  Concentration: Good and Attention Span: Good  Recall:  Good  Fund of Knowledge: Good  Language: Good  Akathisia:  No  Handed:  Right  AIMS (if indicated): not done  Assets:  Communication Skills Desire for Improvement Financial Resources/Insurance Housing Physical Health Social Support  ADL's:  Intact  Cognition: WNL  Sleep:  Fair   Screenings: GAD-7    Flowsheet Row Clinical Support from 11/17/2020 in [facility] Counselor  from 09/12/2020 in [facility] Clinical Support from 08/16/2020 in [facility] Office Visit from 05/16/2020 in [facility]  Total GAD-7 Score 17 16 16 19       PHQ2-9    Flowsheet Row Clinical Support from 11/17/2020 in [facility] Counselor from 09/12/2020 in [facility] Clinical Support from 08/16/2020 in [facility] Office Visit from 05/16/2020 in [facility]  PHQ-2 Total Score 6 4 4 5   PHQ-9 Total Score 21 19 19 18       Flowsheet Row ED from 11/17/2020 in [facility] Most recent reading at 11/17/2020  1:14 PM Clinical Support from 11/17/2020 in [facility] Most recent reading at 11/17/2020 11:17 AM Counselor from 09/12/2020 in [facility] Most recent reading at 09/12/2020  2:02 PM  C-SSRS RISK CATEGORY High Risk Error: Q7 should not be populated when Q6 is No No Risk        Assessment and Plan: Symptoms of anxiety and depression due to life stressors.  Today he also endorsed suicidal ideation with a plan to overdose on sleeping medications.  At this time medications not refilled.  Patient voluntarily walked down to [facility] for further evaluation.   1. Generalized anxiety disorder   2. Suicide ideation   3. Severe episode of recurrent major depressive disorder, without psychotic features (HCC)     Follow-up in 3 months Follow-up with therapy  11/12/2020, NP 11/17/2020, 1:16 PM
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
## 2020-11-17 NOTE — ED Notes (Signed)
CBG checked and resulted 355.  Provider notified.  Pt also reported he ate something sweet prior to taking CBG level.

## 2020-11-17 NOTE — ED Provider Notes (Signed)
Behavioral Health Admission H&P [facility] & OBS)  Date: 11/17/20 Patient Name: Lance Chapman MRN: [id number] Chief Complaint:  Chief Complaint  Patient presents with   Suicidal      Diagnoses:  Final diagnoses:  Severe episode of recurrent major depressive disorder, without psychotic features [facility])    HPI: Patient presents voluntarily to [facility] behavioral health for walk-in assessment.  He was seen by outpatient psychiatry at [facility] behavioral health earlier this date, reported suicidal ideation.  Lance Chapman reports suicidal ideation with a plan to overdose on sleeping pills times approximately 1 week.  He denies intent to harm self.  He reports "I did not do it mostly because of my mom and my girlfriend."  Recent stressors include caring for his 20 year old mother who has been diagnosed with dementia.  Patient's mother experienced a fall 2 months ago and now requires a walker for ambulation and increased assistance. Lance Chapman reports feeling overwhelmed by chores and duties in the home as well as his role in caring for his mother.  Additionally he reports financial concerns.  Chronic stressors include a verbal disagreement with his mother 3 years ago.  During a verbal disagreement patient's mother stated "I did not want you I hate you, I was upset when I found out I was pregnant because I did not want another baby."  Patient reports he was very hurt by this statement.  Lance Chapman is insightful and future oriented today.  He reports discussing suicidal ideations with girlfriend.  He discusses plan to travel with girlfriend in the upcoming months.  He reports primary emotional support his girlfriend.  Lance Chapman has been diagnosed with anxiety and is linked with outpatient psychiatry here at [facility] behavioral health.  He reports he is compliance with medications including Prozac and Vistaril.  He is also followed by outpatient counseling at [facility] health.  Patient  is assessed face-to-face by nurse practitioner.  He is seated in assessment area, no acute distress.  He is alert and oriented, pleasant and cooperative during assessment.  He reports depressed mood with congruent affect.   He denies homicidal ideations.  He denies history of self-harm behaviors.  He contracts verbally for safety with this Clinical research associate.  He has normal speech and behavior.  He denies both auditory and visual hallucinations.  Patient is able to converse coherently with goal-directed thoughts and no distractibility or preoccupation.  He denies paranoia.  Objectively there is no evidence of psychosis/mania or delusional thinking.  Patient resides in [place] with his mother, he denies access to weapons.  He receives disability benefits.  He denies alcohol and substance use.  He endorses average appetite and decreased sleep.  Patient offered support and encouragement.  He agrees with plan for overnight observation.  Patient remains voluntary at this time.  PHQ 2-9:  Flowsheet Row Clinical Support from 11/17/2020 in [facility] Counselor from 09/12/2020 in [facility] Clinical Support from 08/16/2020 in [facility]  Thoughts that you would be better off dead, or of hurting yourself in some way More than half the days Nearly every day Nearly every day  PHQ-9 Total Score Flowsheet Row Clinical Support from 11/17/2020 in [facility] Counselor from 09/12/2020 in [facility] Clinical Support from 08/16/2020 in [facility]  C-SSRS RISK CATEGORY Error: Q7 should not be populated when Q6 is No No  Risk Error: Q7 should not be populated when Q6 is No        Total Time spent with patient: 30 minutes  Musculoskeletal  Strength & Muscle Tone: within normal limits Gait & Station: normal Patient leans:  N/A  Psychiatric Specialty Exam  Presentation General Appearance: Appropriate for Environment; Casual  Eye Contact:Good  Speech:Clear and Coherent; Normal Rate  Speech Volume:Normal  Handedness:Right   Mood and Affect  Mood:Depressed  Affect:Congruent; Depressed   Thought Process  Thought Processes:Coherent; Goal Directed; Linear  Descriptions of Associations:Intact  Orientation:Full (Time, Place and Person)  Thought Content:Logical; WDL  Diagnosis of Schizophrenia or Schizoaffective disorder in past: No   Hallucinations:Hallucinations: None  Ideas of Reference:None  Suicidal Thoughts:Suicidal Thoughts: Yes, Passive SI Passive Intent and/or Plan: With Plan; Without Intent  Homicidal Thoughts:Homicidal Thoughts: No   Sensorium  Memory:Immediate Good; Recent Good; Remote Good  Judgment:Good  Insight:Fair   Executive Functions  Concentration:Good  Attention Span:Good  Recall:Good  Fund of Knowledge:Good  Language:Good   Psychomotor Activity  Psychomotor Activity:Psychomotor Activity: Normal   Assets  Assets:Communication Skills; Desire for Improvement; Financial Resources/Insurance; Housing; Intimacy; Leisure Time; Physical Health; Resilience; Social Support; Talents/Skills; Transportation   Sleep  Sleep:Sleep: Good   Nutritional Assessment (For OBS and FBC admissions only) Has the patient had a weight loss or gain of 10 pounds or more in the last 3 months?: No Has the patient had a decrease in food intake/or appetite?: No Does the patient have dental problems?: No Does the patient have eating habits or behaviors that may be indicators of an eating disorder including binging or inducing vomiting?: No Has the patient recently lost weight without trying?: 0 Has the patient been eating poorly because of a decreased appetite?: 0 Malnutrition Screening Tool Score: 0    Physical Exam Vitals and nursing note reviewed.  Constitutional:       Appearance: Normal appearance. He is well-developed.  HENT:     Head: Normocephalic and atraumatic.     Nose: Nose normal.  Cardiovascular:     Rate and Rhythm: Normal rate.  Pulmonary:     Effort: Pulmonary effort is normal.  Musculoskeletal:        General: Normal range of motion.  Skin:    General: Skin is warm and dry.  Neurological:     Mental Status: He is alert and oriented to person, place, and time.  Psychiatric:        Attention and Perception: Attention and perception normal.        Mood and Affect: Affect normal. Mood is depressed.        Speech: Speech normal.        Behavior: Behavior normal. Behavior is cooperative.        Thought Content: Thought content includes suicidal ideation.        Cognition and Memory: Cognition and memory normal.        Judgment: Judgment normal.   Review of Systems  Constitutional: Negative.   HENT: Negative.    Eyes: Negative.   Respiratory: Negative.    Cardiovascular: Negative.   Gastrointestinal: Negative.   Genitourinary: Negative.   Musculoskeletal: Negative.   Skin: Negative.   Neurological: Negative.   Endo/Heme/Allergies: Negative.   Psychiatric/Behavioral:  Positive for depression and suicidal ideas.    Blood pressure (!) 137/96, pulse 77, temperature 98.5 F (36.9 C), temperature source Oral, resp. rate 16, SpO2 98 %. There is no height or weight on file to calculate BMI.  Past Psychiatric  History: Generalized anxiety disorder, PTSD per patient report.  Is the patient at risk to self? No  Has the patient been a risk to self in the past 6 months? No .    Has the patient been a risk to self within the distant past? No   Is the patient a risk to others? No   Has the patient been a risk to others in the past 6 months? No   Has the patient been a risk to others within the distant past? No   Past Medical History:  Past Medical History:  Diagnosis Date   Anxiety    Back pain, chronic    Depression    Hypertension     UTI (lower urinary tract infection)    No past surgical history on file.  Family History: No family history on file.  Social History:  Social History   Socioeconomic History   Marital status: Single    Spouse name: Not on file   Number of children: Not on file   Years of education: Not on file   Highest education level: Not on file  Occupational History   Not on file  Tobacco Use   Smoking status: Never   Smokeless tobacco: Never  Substance and Sexual Activity   Alcohol use: No   Drug use: No   Sexual activity: Not on file  Other Topics Concern   Not on file  Social History Narrative   Not on file   Social Determinants of Health   Financial Resource Strain: Not on file  Food Insecurity: Not on file  Transportation Needs: Not on file  Physical Activity: Not on file  Stress: Not on file  Social Connections: Not on file  Intimate Partner Violence: Not on file    SDOH:  SDOH Screenings   Alcohol Screen: Not on file  Depression (PHQ2-9): Medium Risk   PHQ-2 Score: 21  Financial Resource Strain: Not on file  Food Insecurity: Not on file  Housing: Not on file  Physical Activity: Not on file  Social Connections: Not on file  Stress: Not on file  Tobacco Use: Low Risk    Smoking Tobacco Use: Never   Smokeless Tobacco Use: Never   Passive Exposure: Not on file  Transportation Needs: Not on file    Last Labs:  No visits with results within 6 Month(s) from this visit.  Latest known visit with results is:  Admission on 05/14/2020, Discharged on 05/15/2020  Component Date Value Ref Range Status   Sodium 05/14/2020 135  135 - 145 mmol/L Final   Potassium 05/14/2020 3.2 (L)  3.5 - 5.1 mmol/L Final   Chloride 05/14/2020 100  98 - 111 mmol/L Final   CO2 05/14/2020 27  22 - 32 mmol/L Final   Glucose, Bld 05/14/2020 312 (H)  70 - 99 mg/dL Final   Glucose reference range applies only to samples taken after fasting for at least 8 hours.   BUN 05/14/2020 33 (H)  6 -  20 mg/dL Final   Creatinine, Ser 05/14/2020 1.56 (H)  0.61 - 1.24 mg/dL Final   Calcium 16/10/9602 10.0  8.9 - 10.3 mg/dL Final   Total Protein 54/09/8117 7.7  6.5 - 8.1 g/dL Final   Albumin 14/78/2956 4.3  3.5 - 5.0 g/dL Final   AST 21/30/8657 27  15 - 41 U/L Final   ALT 05/14/2020 34  0 - 44 U/L Final   Alkaline Phosphatase 05/14/2020 57  38 - 126 U/L Final  Total Bilirubin 05/14/2020 0.3  0.3 - 1.2 mg/dL Final   GFR, Estimated 05/14/2020 51 (L)  >60 mL/min Final   Comment: (NOTE) Calculated using the CKD-EPI Creatinine Equation (2021)    Anion gap 05/14/2020 8  5 - 15 Final   Performed at [facility], [address] [place], [place] [address]   Alcohol, Ethyl (B) 05/14/2020 <10  <10 mg/dL Final   Comment: (NOTE) Lowest detectable limit for serum alcohol is 10 mg/dL.  For medical purposes only. Performed at [facility], [address], [place], [place] [address]    Salicylate Lvl 05/14/2020 <7.0 (L)  7.0 - 30.0 mg/dL Final   Performed at [facility], [address] [place], [place] [address]   Acetaminophen (Tylenol), Serum 05/14/2020 11  10 - 30 ug/mL Final   Comment: (NOTE) Therapeutic concentrations vary significantly. A range of 10-30 ug/mL  may be an effective concentration for many patients. However, some  are best treated at concentrations outside of this range. Acetaminophen concentrations >150 ug/mL at 4 hours after ingestion  and >50 ug/mL at 12 hours after ingestion are often associated with  toxic reactions.  Performed at [facility], [address] [place], [place] [address]    WBC 05/14/2020 8.9  4.0 - 10.5 K/uL Final   RBC 05/14/2020 4.58  4.22 - 5.81 MIL/uL Final   Hemoglobin 05/14/2020 13.9  13.0 - 17.0 g/dL Final   HCT 02/23/3610 41.7  39.0 - 52.0 % Final   MCV 05/14/2020 91.0  80.0 - 100.0 fL Final   MCH 05/14/2020 30.3  26.0 - 34.0 pg Final   MCHC 05/14/2020 33.3  30.0  - 36.0 g/dL Final   RDW 24/49/7530 11.5  11.5 - 15.5 % Final   Platelets 05/14/2020 336  150 - 400 K/uL Final   nRBC 05/14/2020 0.0  0.0 - 0.2 % Final   Performed at [facility], [address], [place], [place] [address]   Opiates 05/14/2020 POSITIVE (A)  NONE DETECTED Final   Cocaine 05/14/2020 NONE DETECTED  NONE DETECTED Final   Benzodiazepines 05/14/2020 NONE DETECTED  NONE DETECTED Final   Amphetamines 05/14/2020 NONE DETECTED  NONE DETECTED Final   Tetrahydrocannabinol 05/14/2020 NONE DETECTED  NONE DETECTED Final   Barbiturates 05/14/2020 NONE DETECTED  NONE DETECTED Final   Comment: (NOTE) DRUG SCREEN FOR MEDICAL PURPOSES ONLY.  IF CONFIRMATION IS NEEDED FOR ANY PURPOSE, NOTIFY LAB WITHIN 5 DAYS.  LOWEST DETECTABLE LIMITS FOR URINE DRUG SCREEN Drug Class                     Cutoff (ng/mL) Amphetamine and metabolites    1000 Barbiturate and metabolites    200 Benzodiazepine                 200 Tricyclics and metabolites     300 Opiates and metabolites        300 Cocaine and metabolites        300 THC                            50 Performed at [facility], [address], [place], [place] [address]    Glucose-Capillary 05/14/2020 280 (H)  70 - 99 mg/dL Final   Glucose reference range applies only to samples taken after fasting for at least 8 hours.   Comment 1 05/14/2020 Notify RN  Final   Comment 2 05/14/2020 Document in Chart   Final   SARS Coronavirus 2 by RT PCR 05/14/2020 NEGATIVE  NEGATIVE Final   Comment: (NOTE) SARS-CoV-2 target nucleic acids are NOT DETECTED.  The SARS-CoV-2 RNA is generally detectable in upper respiratory specimens during the acute phase of infection. The lowest concentration of SARS-CoV-2 viral copies this assay can detect is 138 copies/mL. A negative result does not preclude SARS-Cov-2 infection and should not be used as the sole basis for treatment or other patient management decisions. A negative  result may occur with  improper specimen collection/handling, submission of specimen other than nasopharyngeal swab, presence of viral mutation(s) within the areas targeted by this assay, and inadequate number of viral copies(<138 copies/mL). A negative result must be combined with clinical observations, patient history, and epidemiological information. The expected result is Negative.  Fact Sheet for Patients:  BloggerCourse.com  Fact Sheet for Healthcare Providers:  SeriousBroker.it  This test is no                          t yet approved or cleared by the [place] FDA and  has been authorized for detection and/or diagnosis of SARS-CoV-2 by FDA under an Emergency Use Authorization (EUA). This EUA will remain  in effect (meaning this test can be used) for the duration of the COVID-19 declaration under Section 564(b)(1) of the Act, 21 U.S.C.section 360bbb-3(b)(1), unless the authorization is terminated  or revoked sooner.       Influenza A by PCR 05/14/2020 NEGATIVE  NEGATIVE Final   Influenza B by PCR 05/14/2020 NEGATIVE  NEGATIVE Final   Comment: (NOTE) The Xpert Xpress SARS-CoV-2/FLU/RSV plus assay is intended as an aid in the diagnosis of influenza from Nasopharyngeal swab specimens and should not be used as a sole basis for treatment. Nasal washings and aspirates are unacceptable for Xpert Xpress SARS-CoV-2/FLU/RSV testing.  Fact Sheet for Patients: BloggerCourse.com  Fact Sheet for Healthcare Providers: SeriousBroker.it  This test is not yet approved or cleared by the [place] FDA and has been authorized for detection and/or diagnosis of SARS-CoV-2 by FDA under an Emergency Use Authorization (EUA). This EUA will remain in effect (meaning this test can be used) for the duration of the COVID-19 declaration under Section 564(b)(1) of the Act, 21 U.S.C. section  360bbb-3(b)(1), unless the authorization is terminated or revoked.  Performed at [facility], [address], [place], [place] [address]     Allergies: Bee venom and Benadryl allergy [diphenhydramine]  PTA Medications: (Not in a hospital admission)   Medical Decision Making  Patient reviewed with Dr. Bronwen Betters.  He will be placed in observation area at [facility] health for treatment and stabilization.  He will be reassessed on 11/18/2020, disposition will be determined at that time.  Patient remains voluntary at this time. Laboratory studies ordered including CBC, CMP, ethanol, A1c, hepatic function, lipid panel, magnesium and TSH.  Urine drug screen ordered.  EKG order initiated. Current medications: -Acetaminophen 650 mg every 6 as needed/mild pain -Maalox 30 mL oral every 4 as needed/digestion -Aspirin EC 81 mg daily -Atorvastatin 20 mg daily -Empagliflozin 10 mg daily -Fluoxetine 40 mg daily -Hydroxyzine 25 mg 3 times daily as needed/anxiety -Linagliptin 5 mg daily -Lisinopril-hydrochlorothiazide 20-12.5 mg daily -Magnesium hydroxide 30 mL daily as needed/mild constipation -Tizanidine 2 mg 3 times daily as needed/muscle spasm -Trazodone 50 mg nightly as needed/sleep      Recommendations  Based  on my evaluation the patient does not appear to have an emergency medical condition.  Lenard Lance, FNP 11/17/20  1:08 PM

## 2020-11-17 NOTE — ED Notes (Signed)
Pt sleeping@this time. Breathing even and unlabored. Will continue to monitor for safety 

## 2020-11-17 NOTE — BH Assessment (Signed)
Kizzie Furnish. Dorner, Routine, MR#[id number]; 55 years old presents this date with [facility] Counselor, [place]. Pt reports SI two weeks ago with a plan to overdose on medication.  Pt denies HI or AVH.  Pt admits to prior MH diagnosis or prescribed medication for symptom management.  MSE signed by patient

## 2020-11-17 NOTE — ED Notes (Signed)
Pt calm and cooperative. No c/o pain or distress. Will continue to monitor for safety 

## 2020-11-18 DIAGNOSIS — F332 Major depressive disorder, recurrent severe without psychotic features: Secondary | ICD-10-CM

## 2020-11-18 LAB — GLUCOSE, CAPILLARY
Glucose-Capillary: 355 mg/dL — ABNORMAL HIGH (ref 70–99)
Glucose-Capillary: 206 mg/dL — ABNORMAL HIGH (ref 70–99)

## 2020-11-18 LAB — PROLACTIN: Prolactin: 8.6 ng/mL (ref 4.0–15.2)

## 2020-11-18 MED ORDER — TRAZODONE HCL 50 MG PO TABS
50.0000 mg | ORAL_TABLET | Freq: Every evening | ORAL | 0 refills | Status: DC | PRN
Start: 2020-11-18 — End: 2021-07-19

## 2020-11-18 NOTE — ED Provider Notes (Signed)
FBC/OBS ASAP Discharge Summary  Date and Time: 11/18/2020 8:44 AM  Name: Lance Chapman  MRN:  [id number]   Discharge Diagnoses:  Final diagnoses:  Severe episode of recurrent major depressive disorder, without psychotic features (HCC)    Subjective: Patient verbalizes readiness to discharge home.  He states, I am feeling better." Lance Chapman reports feeling supported by his brother who is currently visiting mother's home from out of town.  Brother will assume some of the caregiver role over the holidays to allow patient a small break in caring for his mother.  Lance Chapman has been diagnosed with anxiety and depression.  He is followed by outpatient psychiatry at [facility] behavioral health.  He reports compliance with medications.  He is also followed by outpatient therapy at [facility] health.  Patient is assessed face-to-face by nurse practitioner.  He is seated in observation area, no acute distress.  He is alert and oriented, pleasant and cooperative during assessment.   He presents with euthymic mood and bright affect. He denies suicidal and homicidal ideations. He contracts verbally for safety with this Clinical research associate.   He has normal speech and behavior.  He denies both auditory and visual hallucinations.  Patient is able to converse coherently with goal-directed thoughts and no distractibility or preoccupation.  He denies paranoia.  Objectively there is no evidence of psychosis/mania or delusional thinking.  Patient offered support and encouragement.  He declines anyone to call for collateral at this time.  He reports his brother is aware of situation and is picking him up today.   Stay Summary: HPI from 11/17/2020 at 1308pm: Patient presents voluntarily to [facility] behavioral health for walk-in assessment.  He was seen by outpatient psychiatry at [facility] behavioral health earlier this date, reported suicidal ideation.   Lance Chapman reports suicidal ideation with a plan to  overdose on sleeping pills times approximately 1 week.  He denies intent to harm self.  He reports "I did not do it mostly because of my mom and my girlfriend."   Recent stressors include caring for his 85 year old mother who has been diagnosed with dementia.  Patient's mother experienced a fall 2 months ago and now requires a walker for ambulation and increased assistance. Lance Chapman reports feeling overwhelmed by chores and duties in the home as well as his role in caring for his mother.  Additionally he reports financial concerns.   Chronic stressors include a verbal disagreement with his mother 3 years ago.  During a verbal disagreement patient's mother stated "I did not want you I hate you, I was upset when I found out I was pregnant because I did not want another baby."  Patient reports he was very hurt by this statement.   Lance Chapman is insightful and future oriented today.  He reports discussing suicidal ideations with girlfriend.  He discusses plan to travel with girlfriend in the upcoming months.  He reports primary emotional support his girlfriend.   Lance Chapman has been diagnosed with anxiety and is linked with outpatient psychiatry here at [facility] behavioral health.  He reports he is compliance with medications including Prozac and Vistaril.  He is also followed by outpatient counseling at [facility] health.   Patient is assessed face-to-face by nurse practitioner.  He is seated in assessment area, no acute distress.  He is alert and oriented, pleasant and cooperative during assessment.  He reports depressed mood with congruent affect.   He denies homicidal ideations.  He denies history of self-harm behaviors.  He contracts verbally for safety with this Clinical research associate.  He has normal speech and behavior.  He denies both auditory and visual hallucinations.  Patient is able to converse coherently with goal-directed thoughts and no distractibility or preoccupation.  He denies paranoia.   Objectively there is no evidence of psychosis/mania or delusional thinking.   Patient resides in [place] with his mother, he denies access to weapons.  He receives disability benefits.  He denies alcohol and substance use.  He endorses average appetite and decreased sleep.   Patient offered support and encouragement.  He agrees with plan for overnight observation.  Patient remains voluntary at this time.   Total Time spent with patient: 30 minutes  Past Psychiatric History: GAD, MDD Past Medical History:  Past Medical History:  Diagnosis Date   Anxiety    Back pain, chronic    Depression    Hypertension    UTI (lower urinary tract infection)    No past surgical history on file. Family History: No family history on file. Family Psychiatric History: none reported Social History:  Social History   Substance and Sexual Activity  Alcohol Use No     Social History   Substance and Sexual Activity  Drug Use No    Social History   Socioeconomic History   Marital status: Single    Spouse name: Not on file   Number of children: Not on file   Years of education: Not on file   Highest education level: Not on file  Occupational History   Not on file  Tobacco Use   Smoking status: Never   Smokeless tobacco: Never  Substance and Sexual Activity   Alcohol use: No   Drug use: No   Sexual activity: Not on file  Other Topics Concern   Not on file  Social History Narrative   Not on file   Social Determinants of Health   Financial Resource Strain: Not on file  Food Insecurity: Not on file  Transportation Needs: Not on file  Physical Activity: Not on file  Stress: Not on file  Social Connections: Not on file   SDOH:  SDOH Screenings   Alcohol Screen: Not on file  Depression (PHQ2-9): Medium Risk   PHQ-2 Score: 21  Financial Resource Strain: Not on file  Food Insecurity: Not on file  Housing: Not on file  Physical Activity: Not on file  Social Connections: Not on  file  Stress: Not on file  Tobacco Use: Low Risk    Smoking Tobacco Use: Never   Smokeless Tobacco Use: Never   Passive Exposure: Not on file  Transportation Needs: Not on file    Tobacco Cessation:  N/A, patient does not currently use tobacco products  Current Medications:  Current Facility-Administered Medications  Medication Dose Route Frequency Provider Last Rate Last Admin   acetaminophen (TYLENOL) tablet 650 mg  650 mg Oral Q6H PRN Lenard Lance, FNP       alum & mag hydroxide-simeth (MAALOX/MYLANTA) 200-200-20 MG/5ML suspension 30 mL  30 mL Oral Q4H PRN Lenard Lance, FNP       aspirin EC tablet 81 mg  81 mg Oral Daily Lenard Lance, FNP       atorvastatin (LIPITOR) tablet 20 mg  20 mg Oral Daily Lenard Lance, FNP       empagliflozin (JARDIANCE) tablet 10 mg  10 mg Oral Daily Lenard Lance, FNP       FLUoxetine (PROZAC) capsule 40 mg  40 mg  Oral Daily Lenard Lance, FNP       lisinopril (ZESTRIL) tablet 20 mg  20 mg Oral Daily Lenard Lance, FNP       And   hydrochlorothiazide (HYDRODIURIL) tablet 12.5 mg  12.5 mg Oral Daily Lenard Lance, FNP       hydrOXYzine (ATARAX/VISTARIL) tablet 25 mg  25 mg Oral TID PRN Lenard Lance, FNP       magnesium hydroxide (MILK OF MAGNESIA) suspension 30 mL  30 mL Oral Daily PRN Lenard Lance, FNP       tiZANidine (ZANAFLEX) tablet 2 mg  2 mg Oral TID PRN Lenard Lance, FNP       traZODone (DESYREL) tablet 50 mg  50 mg Oral QHS PRN Lenard Lance, FNP       Current Outpatient Medications  Medication Sig Dispense Refill   acetaminophen (TYLENOL) 500 MG tablet Take 1,000 mg by mouth every 6 (six) hours as needed for headache.     aspirin 81 MG EC tablet Take 81 mg by mouth daily.     atorvastatin (LIPITOR) 20 MG tablet Take 20 mg by mouth daily.     EPINEPHrine 0.3 mg/0.3 mL IJ SOAJ injection Inject 0.3 mg into the muscle as needed for anaphylaxis.     FLUoxetine (PROZAC) 40 MG capsule Take 1 capsule (40 mg total) by mouth daily. 30 capsule 2    hydrOXYzine (ATARAX/VISTARIL) 10 MG tablet Take 1 tablet (10 mg total) by mouth 3 (three) times daily as needed. (Patient taking differently: Take 10 mg by mouth 3 (three) times daily as needed for anxiety.) 90 tablet 2   JARDIANCE 10 MG TABS tablet Take 10 mg by mouth daily.     lisinopril-hydrochlorothiazide (ZESTORETIC) 20-12.5 MG tablet Take 1 tablet by mouth daily.     OZEMPIC, 0.25 OR 0.5 MG/DOSE, 2 MG/1.5ML SOPN Inject 0.3 mg into the skin every Friday.     tiZANidine (ZANAFLEX) 2 MG tablet Take 2 mg by mouth 3 (three) times daily as needed for muscle spasms.     TRESIBA FLEXTOUCH 100 UNIT/ML FlexTouch Pen Inject 20 Units into the skin daily.     traZODone (DESYREL) 50 MG tablet Take 1 tablet (50 mg total) by mouth at bedtime as needed for sleep. 30 tablet 0    PTA Medications: (Not in a hospital admission)   Musculoskeletal  Strength & Muscle Tone: within normal limits Gait & Station: normal Patient leans: N/A  Psychiatric Specialty Exam  Presentation  General Appearance: Appropriate for Environment; Casual  Eye Contact:Good  Speech:Clear and Coherent; Normal Rate  Speech Volume:Normal  Handedness:Right   Mood and Affect  Mood:Euthymic  Affect:Appropriate; Congruent   Thought Process  Thought Processes:Coherent; Goal Directed; Linear  Descriptions of Associations:Intact  Orientation:Full (Time, Place and Person)  Thought Content:Logical; WDL  Diagnosis of Schizophrenia or Schizoaffective disorder in past: No    Hallucinations:Hallucinations: None  Ideas of Reference:None  Suicidal Thoughts:Suicidal Thoughts: No SI Passive Intent and/or Plan: With Plan; Without Intent  Homicidal Thoughts:Homicidal Thoughts: No   Sensorium  Memory:Immediate Good; Recent Good; Remote Good  Judgment:Good  Insight:Good   Executive Functions  Concentration:Good  Attention Span:Good  Recall:Good  Fund of Knowledge:Good  Language:Good   Psychomotor  Activity  Psychomotor Activity:Psychomotor Activity: Normal   Assets  Assets:Communication Skills; Desire for Improvement; Financial Resources/Insurance; Housing; Intimacy; Leisure Time; Physical Health; Resilience; Social Support   Sleep  Sleep:Sleep: Good   Nutritional Assessment (For OBS and [facility] admissions  only) Has the patient had a weight loss or gain of 10 pounds or more in the last 3 months?: No Has the patient had a decrease in food intake/or appetite?: No Does the patient have dental problems?: No Does the patient have eating habits or behaviors that may be indicators of an eating disorder including binging or inducing vomiting?: No Has the patient recently lost weight without trying?: 0 Has the patient been eating poorly because of a decreased appetite?: 0 Malnutrition Screening Tool Score: 0    Physical Exam  Physical Exam Vitals and nursing note reviewed.  Constitutional:      Appearance: Normal appearance. He is well-developed.  HENT:     Head: Normocephalic and atraumatic.     Nose: Nose normal.  Cardiovascular:     Rate and Rhythm: Normal rate.  Pulmonary:     Effort: Pulmonary effort is normal.  Musculoskeletal:        General: Normal range of motion.     Cervical back: Normal range of motion.  Skin:    General: Skin is warm and dry.  Neurological:     Mental Status: He is alert and oriented to person, place, and time.  Psychiatric:        Attention and Perception: Attention and perception normal.        Mood and Affect: Mood and affect normal.        Speech: Speech normal.        Behavior: Behavior normal. Behavior is cooperative.        Thought Content: Thought content normal.        Cognition and Memory: Cognition and memory normal.        Judgment: Judgment normal.   Review of Systems  Constitutional: Negative.   HENT: Negative.    Eyes: Negative.   Respiratory: Negative.    Cardiovascular: Negative.   Gastrointestinal: Negative.    Genitourinary: Negative.   Musculoskeletal: Negative.   Skin: Negative.   Neurological: Negative.   Endo/Heme/Allergies: Negative.   Psychiatric/Behavioral: Negative.    Blood pressure 125/81, pulse 75, temperature 98.1 F (36.7 C), temperature source Oral, resp. rate 20, SpO2 97 %. There is no height or weight on file to calculate BMI.  Demographic Factors:  Male  Loss Factors: NA  Historical Factors: NA  Risk Reduction Factors:   Sense of responsibility to family, Living with another person, especially a relative, Positive social support, Positive therapeutic relationship, and Positive coping skills or problem solving skills  Continued Clinical Symptoms:  Previous Psychiatric Diagnoses and Treatments  Cognitive Features That Contribute To Risk:  None    Suicide Risk:  Minimal: No identifiable suicidal ideation.  Patients presenting with no risk factors but with morbid ruminations; may be classified as minimal risk based on the severity of the depressive symptoms  Plan Of Care/Follow-up recommendations:  Patient reviewed with Dr. Bronwen Betters. Follow-up with established outpatient psychiatry at [facility] behavioral health. Current medications include: -Fluoxetine 40 mg daily -Hydroxyzine 10 mg 3 times daily as needed/anxiety -Trazodone 50 mg nightly as needed/sleep  Disposition: Discharge  Lenard Lance, FNP 11/18/2020, 8:44 AM

## 2020-11-18 NOTE — Progress Notes (Signed)
AVS reviewed with patient and all questions answered.  Patient verbalized understanding of information presented.

## 2020-11-18 NOTE — ED Notes (Signed)
Pt sleeping@this time. Breathing even and unlabored. Will continue to monitor for safety 

## 2020-11-18 NOTE — Progress Notes (Signed)
Patient's brother here to pick him up.  Patient denied SI, HI, AVH.  Stated he feels safe and ready for discharge. Patient ambulated independently to lobby without issue.  All belongings returned and belongings sheet signed.  Patient discharged in stable condition; no acute distress noted.

## 2020-11-18 NOTE — Discharge Instructions (Signed)

## 2020-11-22 ENCOUNTER — Telehealth (HOSPITAL_COMMUNITY): Payer: Self-pay | Admitting: Family Medicine

## 2020-11-22 NOTE — BH Assessment (Signed)
Care Management - Follow Up Discharges   Writer attempted to make contact with patient today and was unsuccessful.  Writer left a HIPPA compliant voice message.   Per chart review, patient has a follow up appointment with Deloris Ping, LCSW on 11-28-2020 and 12-06-2020.

## 2020-11-28 ENCOUNTER — Ambulatory Visit (HOSPITAL_COMMUNITY): Payer: Medicare HMO | Admitting: Clinical

## 2020-12-06 ENCOUNTER — Ambulatory Visit (HOSPITAL_COMMUNITY): Payer: Medicare HMO | Admitting: Clinical

## 2021-01-17 ENCOUNTER — Ambulatory Visit (INDEPENDENT_AMBULATORY_CARE_PROVIDER_SITE_OTHER): Payer: Medicare HMO | Admitting: Clinical

## 2021-01-17 ENCOUNTER — Other Ambulatory Visit: Payer: Self-pay

## 2021-01-17 DIAGNOSIS — F331 Major depressive disorder, recurrent, moderate: Secondary | ICD-10-CM
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
## 2021-01-18 NOTE — Plan of Care (Signed)
Client was in agreement with the treatment plan. °
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
## 2021-03-23 ENCOUNTER — Other Ambulatory Visit: Payer: Self-pay

## 2021-03-23 ENCOUNTER — Ambulatory Visit (INDEPENDENT_AMBULATORY_CARE_PROVIDER_SITE_OTHER): Payer: Medicare HMO | Admitting: Clinical

## 2021-03-23 DIAGNOSIS — F411 Generalized anxiety disorder: Secondary | ICD-10-CM
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
## 2021-04-05 ENCOUNTER — Telehealth (HOSPITAL_COMMUNITY): Payer: Self-pay | Admitting: Psychiatry

## 2021-04-05 NOTE — Telephone Encounter (Signed)
Brother Macari Zalesky [phone] requesting a provider review patient record and call him regarding concerns.  Pt last saw Doyne Keel Nov 2022 and did not schedule a follow-up. Per Doyne Keel instructions "follow up as needed if symptoms worsen or fail." Please call Fraser Din to advise.

## 2021-04-06 ENCOUNTER — Ambulatory Visit (HOSPITAL_COMMUNITY): Payer: Self-pay | Admitting: Clinical

## 2021-04-10 NOTE — Telephone Encounter (Signed)
DPR Architect) signed and scanned on 05/16/20, lists brother al authorized to verbally communicate with.
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
## 2021-04-25 ENCOUNTER — Encounter (HOSPITAL_COMMUNITY): Payer: Self-pay

## 2021-04-25 ENCOUNTER — Emergency Department (HOSPITAL_COMMUNITY)
Admission: EM | Admit: 2021-04-25 | Discharge: 2021-04-25 | Disposition: A | Discharge status: Home / Self Care | Payer: Medicare Other | Attending: Emergency Medicine | Admitting: Emergency Medicine

## 2021-04-25 ENCOUNTER — Ambulatory Visit (INDEPENDENT_AMBULATORY_CARE_PROVIDER_SITE_OTHER): Payer: Medicare Other | Admitting: Clinical

## 2021-04-25 DIAGNOSIS — F331 Major depressive disorder, recurrent, moderate: Secondary | ICD-10-CM

## 2021-04-25 DIAGNOSIS — Z7982 Long term (current) use of aspirin: Secondary | ICD-10-CM | POA: Insufficient documentation

## 2021-04-25 DIAGNOSIS — R739 Hyperglycemia, unspecified: Secondary | ICD-10-CM

## 2021-04-25 DIAGNOSIS — R000 Tachycardia, unspecified: Secondary | ICD-10-CM | POA: Insufficient documentation

## 2021-04-25 DIAGNOSIS — E1165 Type 2 diabetes mellitus with hyperglycemia: Secondary | ICD-10-CM | POA: Diagnosis not present

## 2021-04-25 LAB — BASIC METABOLIC PANEL
Anion gap: 11 (ref 5–15)
BUN: 20 mg/dL (ref 6–20)
CO2: 23 mmol/L (ref 22–32)
Calcium: 10 mg/dL (ref 8.9–10.3)
Chloride: 94 mmol/L — ABNORMAL LOW (ref 98–111)
Creatinine, Ser: 1.28 mg/dL — ABNORMAL HIGH (ref 0.61–1.24)
GFR, Estimated: >60 mL/min (ref >60)
Glucose, Bld: 409 mg/dL — ABNORMAL HIGH (ref 70–99)
Potassium: 4 mmol/L (ref 3.5–5.1)
Sodium: 128 mmol/L — ABNORMAL LOW (ref 135–145)

## 2021-04-25 LAB — CBG MONITORING, ED
Glucose-Capillary: 430 mg/dL — ABNORMAL HIGH (ref 70–99)
Glucose-Capillary: 249 mg/dL — ABNORMAL HIGH (ref 70–99)

## 2021-04-25 LAB — I-STAT VENOUS BLOOD GAS, ED
Acid-Base Excess: 3 mmol/L — ABNORMAL HIGH (ref 0.0–2.0)
Bicarbonate: 30.9 mmol/L — ABNORMAL HIGH (ref 20.0–28.0)
Calcium, Ion: 1.26 mmol/L (ref 1.15–1.40)
HCT: 46 % (ref 39.0–52.0)
Hemoglobin: 15.6 g/dL (ref 13.0–17.0)
O2 Saturation: 45 %
Potassium: 4.7 mmol/L (ref 3.5–5.1)
Sodium: 130 mmol/L — ABNORMAL LOW (ref 135–145)
TCO2: 33 mmol/L — ABNORMAL HIGH (ref 22–32)
pCO2, Ven: 56.3 mmHg (ref 44–60)
pH, Ven: 7.347 (ref 7.25–7.43)
pO2, Ven: 27 mmHg — CL (ref 32–45)

## 2021-04-25 LAB — CBC
HCT: 44.5 % (ref 39.0–52.0)
Hemoglobin: 14.7 g/dL (ref 13.0–17.0)
MCH: 28.4 pg (ref 26.0–34.0)
MCHC: 33 g/dL (ref 30.0–36.0)
MCV: 86.1 fL (ref 80.0–100.0)
Platelets: 368 10*3/uL (ref 150–400)
RBC: 5.17 MIL/uL (ref 4.22–5.81)
RDW: 11.3 % — ABNORMAL LOW (ref 11.5–15.5)
WBC: 7.8 10*3/uL (ref 4.0–10.5)
nRBC: 0 % (ref 0.0–0.2)

## 2021-04-25 MED ORDER — LACTATED RINGERS IV BOLUS
1000.0000 mL | Freq: Once | INTRAVENOUS | Status: AC
  Administered 2021-04-25: 1000 mL via INTRAVENOUS

## 2021-04-25 MED ORDER — INSULIN ASPART 100 UNIT/ML IJ SOLN: 5.0000 [IU] | Freq: Once | INTRAMUSCULAR | Status: DC

## 2021-04-25 MED ORDER — INSULIN GLARGINE-YFGN 100 UNIT/ML ~~LOC~~ SOLN
20.0000 [IU] | Freq: Once | SUBCUTANEOUS | Status: AC
  Administered 2021-04-25: 20 [IU] via SUBCUTANEOUS
  Filled 2021-04-25: qty 0.2

## 2021-04-25 NOTE — Progress Notes (Signed)
? ?  THERAPIST PROGRESS NOTE ? ?Session Time: 45 minutes ? ?Participation Level: Active ? ?Behavioral Response: CasualAlertAnxious ? ?Type of Therapy: Individual Therapy ? ?Treatment Goals addressed: identify 3 cognitive patterns and beliefs that support depression ? ?ProgressTowards Goals: Progressing ? ?Interventions: CBT and Supportive ? ?Summary:  ?Lance Chapman is a 56 y.o. male who presents for the scheduled session oriented times five, appropriately dressed, and friendly. Client denied hallucinations and delusions. ?Client reported has been worried about his health. Client reported that he has had difficulty with having his medication to manage his diabetes to work. Client reported in recent times when he has obtained a new box ,the dispense tool has not been working. Client reported he has had recent blood work from his doctors who are concerned. Client reported his doctor will be referring him to a nutritionist and scheduling a follow up soon. Client reported otherwise he is continuing to help his mother adjust to being in a nursing home. Client reported she states she wants to be home but they talk twice a day on the phone. Client reported he is doing well with being home by himself. Client reported he has been reflecting on things his mother spoke about in recent times regarding his childhood. Client reported they're intrusive thoughts that he does not know what to do with. Client reported knowing the new information does not necessarily change things and he has come to understand his moms parenting reflects her upbringing. Client reported he also broke up with his girlfriend because her life stressors were causing him stress as well. Client reported he has met someone new and is taking things slow.  ?Evidence of progress towards goal:  client reported he use at least 1 coping method of cognitive restructuring to improve emotional distress related to intrusive thoughts. ? ? ?Suicidal/Homicidal: Nowithout  intent/plan ? ?Therapist Response:  ?Therapist began the appointment asking the client how he has been doing since last seen. ?Therapist used CBT to engage using active listening and positive emotional support towards his thoughts and feelings. ?Therapist used CBT to engage and ask the client about stressors impacting his mood. ?Therapist used CBT to reinforce priority of caring for physical health needs. ?Therapist used CBT to engage with the client to process the meaning of his identified intrusive and brainstorming balanced perspective. ?Therapist used CBT ask the client to identify his progress with frequency of use with coping skills with continued practice in his daily activity.  ?Client was assigned to practice self care and to contact attend his follow up medical appointments. ?Client was scheduled for next appointment. ? ? ?Plan: Return again in 5 weeks. ? ?Diagnosis: moderate recurrent major depression ? ?Collaboration of Care: Patient refused AEB none requested by the client. ? ?Patient/Guardian was advised Release of Information must be obtained prior to any record release in order to collaborate their care with an outside provider. Patient/Guardian was advised if they have not already done so to contact the registration department to sign all necessary forms in order for [place] to release information regarding their care.  ? ?Consent: Patient/Guardian gives verbal consent for treatment and assignment of benefits for services provided during this visit. Patient/Guardian expressed understanding and agreed to proceed.  ? ?Birdena Jubilee Eryn Marandola, LCSW ?04/25/2021 ? ?

## 2021-04-25 NOTE — ED Notes (Signed)
Reviewed discharge instructions with patient. Follow-up care and medications reviewed. Patient  verbalized understanding. Patient A&Ox4, VSS, and ambulatory with steady gait upon discharge.  °

## 2021-04-25 NOTE — Discharge Instructions (Addendum)
You were in hyperglycemia without evidence of diabetic ketoacidosis. Your blood sugar improved with IV fluids. We hhave given you a dose of long acting insulin to get you to tomorrow when you can refill your Evaristo Bury which according to our diabetes coordinator appears to be a defective batch. Follow-up with your PCP this Thursday ?

## 2021-04-25 NOTE — ED Triage Notes (Addendum)
Pt. Stated, I was sent here from my Dr. Because my sugar is too high, I do have some left hip pain. ?I take around 25 to 30 units of Insulin myalgias needles on the syringes have not worked so I have not been getting my Insulin. I have referenced to a lot of people  ?

## 2021-04-25 NOTE — ED Provider Notes (Signed)
?[facility] EMERGENCY DEPARTMENT ?Provider Note ? ? ?CSN: [id number] ?Arrival date & time: 04/25/21  1211 ? ?  ? ?History ? ?Chief Complaint  ?Patient presents with  ? Hyperglycemia  ? ? ?Lance Chapman is a 56 y.o. male. ? ? ?Hyperglycemia ? ?56 year old male with a history of diabetes mellitus who presents to the emergency department with a chief complaint of hyperglycemia.  The patient states that he has been having difficulty with needles on his syringes for his [place]. He also takes [place] and Ozempic.  He discussed with his PCP yesterday with a problem with his [place] needles.  He feels like the needles were not properly injecting insulin into his body when he tries to use them.  He has follow-up on Thursday with his PCP to discuss this.  Endorses polyuria and polydipsia.  He denies any headache, fever or chills, shortness of breath or other infectious symptoms. ? ?Home Medications ?Prior to Admission medications   ?Medication Sig Start Date End Date Taking? Authorizing Provider  ?acetaminophen (TYLENOL) 500 MG tablet Take 1,000 mg by mouth every 6 (six) hours as needed for headache.    [provider]  ?aspirin 81 MG EC tablet Take 81 mg by mouth daily. 09/22/20 09/22/21  [provider]  ?atorvastatin (LIPITOR) 20 MG tablet Take 20 mg by mouth daily. 02/05/19   [provider]  ?EPINEPHrine 0.3 mg/0.3 mL IJ SOAJ injection Inject 0.3 mg into the muscle as needed for anaphylaxis. 10/27/14   [provider]  ?FLUoxetine (PROZAC) 40 MG capsule Take 1 capsule (40 mg total) by mouth daily. 08/16/20   Shanna Cisco, NP  ?hydrOXYzine (ATARAX/VISTARIL) 10 MG tablet Take 1 tablet (10 mg total) by mouth 3 (three) times daily as needed. ?Patient taking differently: Take 10 mg by mouth 3 (three) times daily as needed for anxiety. 08/16/20   Shanna Cisco, NP  ?JARDIANCE 10 MG TABS tablet Take 10 mg by mouth daily. 05/09/20   [provider]   ?lisinopril-hydrochlorothiazide (ZESTORETIC) 20-12.5 MG tablet Take 1 tablet by mouth daily.    [provider]  ?OZEMPIC, 0.25 OR 0.5 MG/DOSE, 2 MG/1.5ML SOPN Inject 0.3 mg into the skin every Friday. 03/09/20   [provider]  ?tiZANidine (ZANAFLEX) 2 MG tablet Take 2 mg by mouth 3 (three) times daily as needed for muscle spasms. 07/07/20   [provider]  ?traZODone (DESYREL) 50 MG tablet Take 1 tablet (50 mg total) by mouth at bedtime as needed for sleep. 11/18/20   Lenard Lance, FNP  ?TRESIBA FLEXTOUCH 100 UNIT/ML FlexTouch Pen Inject 20 Units into the skin daily. 06/11/19   [provider]  ?   ? ?Allergies    ?Bee venom and Benadryl allergy [diphenhydramine]   ? ?Review of Systems   ?Review of Systems  ?All other systems reviewed and are negative. ? ?Physical Exam ?Updated Vital Signs ?BP (!) 129/99   Pulse 69   Temp 98.3 ?F (36.8 ?C) (Oral)   Resp (!) 21   Ht 5\' 8"  (1.727 m)   Wt 86.2 kg   SpO2 98%   BMI 28.89 kg/m?  ?Physical Exam ?Vitals and nursing note reviewed.  ?Constitutional:   ?   General: He is not in acute distress. ?   Appearance: He is well-developed.  ?HENT:  ?   Head: Normocephalic and atraumatic.  ?Eyes:  ?   Conjunctiva/sclera: Conjunctivae normal.  ?   Pupils: Pupils are equal, round, and reactive  to light.  ?Cardiovascular:  ?   Rate and Rhythm: Normal rate and regular rhythm.  ?   Heart sounds: No murmur heard. ?Pulmonary:  ?   Effort: Pulmonary effort is normal. No respiratory distress.  ?   Breath sounds: Normal breath sounds.  ?Abdominal:  ?   General: There is no distension.  ?   Palpations: Abdomen is soft.  ?   Tenderness: There is no abdominal tenderness. There is no guarding.  ?Musculoskeletal:     ?   General: No swelling, deformity or signs of injury.  ?   Cervical back: Neck supple.  ?Skin: ?   General: Skin is warm and dry.  ?   Capillary Refill: Capillary refill takes less than 2 seconds.  ?   Findings: No lesion or rash.   ?Neurological:  ?   General: No focal deficit present.  ?   Mental Status: He is alert. Mental status is at baseline.  ?Psychiatric:     ?   Mood and Affect: Mood normal.  ? ? ?ED Results / Procedures / Treatments   ?Labs ?(all labs ordered are listed, but only abnormal results are displayed) ?Labs Reviewed  ?BASIC METABOLIC PANEL - Abnormal; Notable for the following components:  ?    Result Value  ? Sodium 128 (*)   ? Chloride 94 (*)   ? Glucose, Bld 409 (*)   ? Creatinine, Ser 1.28 (*)   ? All other components within normal limits  ?CBC - Abnormal; Notable for the following components:  ? RDW 11.3 (*)   ? All other components within normal limits  ?CBG MONITORING, ED - Abnormal; Notable for the following components:  ? Glucose-Capillary 430 (*)   ? All other components within normal limits  ?I-STAT VENOUS BLOOD GAS, ED - Abnormal; Notable for the following components:  ? pO2, Ven 27 (*)   ? Bicarbonate 30.9 (*)   ? TCO2 33 (*)   ? Acid-Base Excess 3.0 (*)   ? Sodium 130 (*)   ? All other components within normal limits  ?CBG MONITORING, ED - Abnormal; Notable for the following components:  ? Glucose-Capillary 249 (*)   ? All other components within normal limits  ? ? ?EKG ?None ? ?Radiology ?No results found. ? ?Procedures ?Procedures  ? ? ?Medications Ordered in ED ?Medications  ?lactated ringers bolus 1,000 mL (0 mLs Intravenous Stopped 04/25/21 1622)  ?insulin glargine-yfgn (SEMGLEE) injection 20 Units (20 Units Subcutaneous Given 04/25/21 1625)  ? ? ?ED Course/ Medical Decision Making/ A&P ?Clinical Course as of 04/26/21 2127  ?Tue Apr 25, 2021  ?1530 Glucose-Capillary(!): 430 [JL]  ?  ?Clinical Course User Index ?[JL] Ernie AvenaLawsing, Georganne Siple, MD  ? ?                        ?Medical Decision Making ?Amount and/or Complexity of Data Reviewed ?Labs: ordered. Decision-making details documented in ED Course. ? ?Risk ?Prescription drug management. ? ? ?56 year old male with a history of diabetes mellitus who presents to the  emergency department with a chief complaint of hyperglycemia.  The patient states that he has been having difficulty with needles on his syringes for his [place]. He also takes [place] and Ozempic.  He discussed with his PCP yesterday with a problem with his [place] needles.  He feels like the needles were not properly injecting insulin into his body when he tries to use them.  He has follow-up on Thursday with his  PCP to discuss this.  Endorses polyuria and polydipsia.  He denies any headache, fever or chills, shortness of breath or other infectious symptoms. ? ?On arrival, the patient was afebrile, mildly tachycardic, P113, normotensive, saturating 98% on room air.  On my examination, the patient was in normal sinus rhythm on cardiac telemetry with improvement in his tachycardia status post initial IV fluids.  The patient presents with hyperglycemia with initial blood glucose of 409 on BMP without an anion gap acidosis.  He has a pseudohyponatremia to 128.  His creatinine is 1.28. ? ?He was volume resuscitated with a 1 L LR bolus and was tolerating oral intake.  His CBC was without a leukocytosis and anemia.  Given his issue with his Evaristo Bury medications, I did consult our diabetes coordinator who spoke with the patient over the phone.  She states that it sounds like there is a mechanical problem with his current batch of needles and she recommended that he present to his pharmacy for refill and follow-up with his PCP.  She did recommend a long-acting insulin injection for the interim until he can get PCP follow-up and follow-up with an additional refill.  On repeat assessment, the patient's blood glucose was downtrending to 249.  He is at his baseline mental status, tolerating oral intake and overall well-appearing.  No evidence for DKA or HHS.  Overall stable for discharge with close outpatient follow-up with his PCP later this week. ?  ?Final Clinical Impression(s) / ED Diagnoses ?Final diagnoses:   ?Hyperglycemia  ? ? ?Rx / DC Orders ?ED Discharge Orders   ? ? None  ? ?  ? ? ?  ?Ernie Avena, MD ?04/26/21 2127 ? ?

## 2021-04-25 NOTE — Plan of Care (Signed)
?  Problem: Depression CCP Problem  1 coping skills Goal: LTG: Infant WILL SCORE LESS THAN 10 ON THE PATIENT HEALTH QUESTIONNAIRE (PHQ-9) Outcome: Progressing Goal: STG: Lance Chapman WILL IDENTIFY 3 COGNITIVE PATTERNS AND BELIEFS THAT SUPPORT DEPRESSION Outcome: Progressing   

## 2021-04-25 NOTE — Progress Notes (Signed)
Inpatient Diabetes Program Recommendations ? ?AACE/ADA: New Consensus Statement on Inpatient Glycemic Control (2015) ? ?Target Ranges:  Prepandial:   less than 140 mg/dL ?     Peak postprandial:   less than 180 mg/dL (1-2 hours) ?     Critically ill patients:  140 - 180 mg/dL  ? ?Lab Results  ?Component Value Date  ? GLUCAP 430 (H) 04/25/2021  ? HGBA1C 8.0 (H) 11/17/2020  ? ? ?Review of Glycemic Control ? Latest Reference Range & Units 04/25/21 12:46  ?Glucose-Capillary 70 - 99 mg/dL 314 (H)  ? ?Home meds: Jardiance 10 mg daily, Ozempic 0.3 mg weekly, Tresiba 20 units daily ?Inpatient Diabetes Program Recommendations:   ? ?Spoke with patient by phone regarding issues he was having with insulin pens.  He states that he has been on insulin since 2021.  He has had no problems with insulin pens until this box.  He states that when he puts on insulin pen needle and does "test" shot, nothing comes out.  He states he has taken to the pharmacy and that they were not sure what was wrong?  He has appt. Tomorrow back at MD's office and plans to take the box of pens to show to them.  I reminded him that insulin pen are only good for one month after they are accessed?  Sounds like he needs new Rx. For [employer] (which he states is currently at the pharmacy for him to pick up).  He states that the Ozempic pens are working ok.  ?Consider giving patient one time dose of Semglee 20 units x1 today and have patient f/u with PCP tomorrow.  ?Discussed with MD.  ? ?Thanks,  ?Beryl Meager, RN, BC-ADM ?Inpatient Diabetes Coordinator ?Pager [phone]  (8a-5p) ? ?

## 2021-05-31 ENCOUNTER — Ambulatory Visit (INDEPENDENT_AMBULATORY_CARE_PROVIDER_SITE_OTHER): Payer: Medicare Other | Admitting: Clinical

## 2021-05-31 DIAGNOSIS — F331 Major depressive disorder, recurrent, moderate: Secondary | ICD-10-CM | POA: Diagnosis not present

## 2021-05-31 NOTE — Progress Notes (Unsigned)
   THERAPIST PROGRESS NOTE  Session Time: 45 minutes  Participation Level: {BHH PARTICIPATION LEVEL:22264}  Behavioral Response: {Appearance:22683}{BHH LEVEL OF CONSCIOUSNESS:22305}{BHH MOOD:22306}  Type of Therapy: {CHL AMB BH Type of Therapy:21022741}  Treatment Goals addressed: ***  ProgressTowards Goals: {Progress Towards Goals:21014066}  Interventions: {CHL AMB BH Type of Intervention:21022753}  Summary:  Lance Chapman is a 56 y.o. male who presents for the scheduled session oriented times five, appropriately dressed, and friendly. Client denied hallucinations and delusions. Client reported on today he is doing fairly well. Client reported the day he was last seen he got a call from his PCP to go to the hospital. Client reported his glucose levels were high and he received fast medial treatment from [facility] [place]. Client reported he followed up with his PCP and is doing well. Client reported the ordered him more pens that work for his insulin. Client reported otherwise he is managing well at home by himself. Client reported he and his siblings are looking after mom in her nursing home. Client reported they will be moving her to a better facility in [place] soon. Client reported his siblings seem to be more hands on and hovering over him to help look after him. Client reported he appreciates their support but they cannot tell him what to do. Client discussed he recalls although having siblings he was the youngest and did majority of things in life by himself as they  moved forward and graduated from school to start their careers. Client reported overall he does not feel depressed and/ or anxious. Client reported he feels that he is doing better than he was before treatment. Evidence of progress towards goal:  client reported using 1 positive coping skills of positive reframing of thoughts.   Suicidal/Homicidal: Nowithout intent/plan  Therapist Response:  Therapist began the  appointment asking the client how he has been doing since last seen. Therapist used CBT to engage using active listening and positive emotional support. Therapist used CBT to engage and ask the client about his emotions related to physical health changes. Therapist used CBT to engage using open ended questions about life stressors that may contribute to his depression and anxiety symptoms. Therapist used CBT to engage and discuss the practice of gratitude to help with depression symptoms. Therapist used CBT ask the client to identify her progress with frequency of use with coping skills with continued practice in her daily activity.    Therapist assigned the client homework to practice self care. Client was scheduled for next appointment.    Plan: Return again in *** weeks.  Diagnosis: No diagnosis found.  Collaboration of Care: {BH OP Collaboration of Care:21014065}  Patient/Guardian was advised Release of Information must be obtained prior to any record release in order to collaborate their care with an outside provider. Patient/Guardian was advised if they have not already done so to contact the registration department to sign all necessary forms in order for [place] to release information regarding their care.   Consent: Patient/Guardian gives verbal consent for treatment and assignment of benefits for services provided during this visit. Patient/Guardian expressed understanding and agreed to proceed.   [name], LCSW 05/31/2021

## 2021-06-03 NOTE — Plan of Care (Signed)
  Problem: Depression CCP Problem  1 coping skills Goal: LTG: Lance Chapman WILL SCORE LESS THAN 10 ON THE PATIENT HEALTH QUESTIONNAIRE (PHQ-9) Outcome: Progressing Goal: STG: Lance Chapman WILL IDENTIFY 3 COGNITIVE PATTERNS AND BELIEFS THAT SUPPORT DEPRESSION Outcome: Progressing   

## 2021-07-10 ENCOUNTER — Ambulatory Visit (INDEPENDENT_AMBULATORY_CARE_PROVIDER_SITE_OTHER): Payer: Medicare Other | Admitting: Clinical

## 2021-07-10 DIAGNOSIS — F331 Major depressive disorder, recurrent, moderate: Secondary | ICD-10-CM | POA: Diagnosis not present

## 2021-07-10 NOTE — Progress Notes (Signed)
THERAPIST PROGRESS NOTE  Session Time: 45 minutes  Participation Level: Active  Behavioral Response: CasualAlertEuthymic  Type of Therapy: Individual Therapy  Treatment Goals addressed: Client will identify 3 cognitive patterns and beliefs that support depression  ProgressTowards Goals: Progressing  Interventions: CBT and Supportive  Summary:  Lance Chapman is a 56 y.o. male who presents for the scheduled appointment oriented times five, appropriately dressed, and friendly. Client denied hallucinations and delusions. Client reported he is doing fairly well on today. Client reported he signed a release for his brother to speak with the therapist to ask questions. Clients brother called into the session via clients cell phone for inquiry about being able to read the clients therapy notes in [place] and frequency of the clients visits. Otherwise, client discussed his mother was moved to a new nursing home of better quality and he goes to see her as he can. Client reported family conflict because his mother has wanted him to visit more often but due to his reliance on alternative transportation he can not go as frequently. Client reported his brother who lives nearby doesn't seem to communicate with his about going to see their mother since he has a car. Client reported since childhood he has felt that his brother has had an issue with him for some unknown reason. Client reported since he was younger his siblings have always talked at him and not to him even into adult years which made him feel lesser than. Client reported he has been talking to them and seeing some improvement with that. Client reported he does not need his siblings to watch his every move. Client reported he has ongoing anxiety but notes it is related to situation stressors such as finances or trying to make sure he can see his mother as often as possible. Evidence of progress towards goal:  client reported 1 positive measure  of his symptoms overall stating "I am a lot further than I was". Client reported use of positive coping skills such as assertive communication and radical acceptance 7 days out of the week.    Suicidal/Homicidal: Nowithout intent/plan  Therapist Response:  Therapist began the appointment asking the client how he has been doing since last seen. Therapist used CBT to engage using active listening and positive emotional support. Therapist used CBT to engage and addresses questions/ concerns with the clients family support. Therapist used CBT to ask the client open ended questions about recent events that have triggered negative feelings. Therapist used CBT to as the client how stressors relate to his childhood experience and how it has affected him as an adult. Therapist used CBT ask the client to identify his progress with frequency of use with coping skills with continued practice in his daily activity.    Therapist assigned the client homework to practice self care and prioritizing problems that need to be addressed.    Plan: Return again in 5 weeks.  Diagnosis: moderate recurrent major depression  Collaboration of Care: Patient refused AEB therapist provided the client with the supervisors phone number to contact about discrepancy with the viewing summary notes in my chart.  Patient/Guardian was advised Release of Information must be obtained prior to any record release in order to collaborate their care with an outside provider. Patient/Guardian was advised if they have not already done so to contact the registration department to sign all necessary forms in order for [place] to release information regarding their care.   Consent: Patient/Guardian gives verbal consent for treatment  and assignment of benefits for services provided during this visit. Patient/Guardian expressed understanding and agreed to proceed.   Neena Rhymes Cinda Hara, LCSW 07/10/2021

## 2021-07-19 ENCOUNTER — Encounter (HOSPITAL_COMMUNITY): Payer: Self-pay | Admitting: Psychiatry

## 2021-07-19 ENCOUNTER — Ambulatory Visit (INDEPENDENT_AMBULATORY_CARE_PROVIDER_SITE_OTHER): Payer: Medicare Other | Admitting: Psychiatry

## 2021-07-19 DIAGNOSIS — F331 Major depressive disorder, recurrent, moderate: Secondary | ICD-10-CM | POA: Diagnosis not present

## 2021-07-19 DIAGNOSIS — F411 Generalized anxiety disorder: Secondary | ICD-10-CM | POA: Diagnosis not present

## 2021-07-19 MED ORDER — TRAZODONE HCL 50 MG PO TABS: 50.0000 mg | ORAL_TABLET | Freq: Every evening | ORAL | 3 refills | Status: DC | PRN

## 2021-07-19 MED ORDER — FLUOXETINE HCL 40 MG PO CAPS: 40.0000 mg | ORAL_CAPSULE | Freq: Every day | ORAL | 3 refills | Status: DC

## 2021-07-19 NOTE — Progress Notes (Signed)
BH MD/PA/NP OP Progress Note  07/19/2021 1:55 PM Lance Chapman  MRN:  [id number]  Chief Complaint: "I am staying busy"   HPI: 56 year old male seen today for follow up psychiatric evaluation. He has a psychiatric history of anxiety, depression, SI, and PTSD. He is currently managed on Prozac 40 mg daily and Trazodone 50 mg three nightly as needed. He notes his medications are effective in managing his psychiatric conditions.   Today he is well-groomed, pleasant, cooperative, engaged in conversation, and maintains eye contact.  He notes that he has been staying busy.  He reports that his 26 year old mother is no longer in a nursing facility.  He notes that she has returned home since being hospitalized and is now on hospice.  He reports that he stays busy caring for her in their household.  Patient reports that his mood is stable and notes that he has minimal anxiety and depression.  Today provider conducted a GAD-7 and patient scored a 9.  Provider also conducted PHQ-9 patient scored an 11.  He endorses adequate sleep and appetite.  Today he denies SI/HI/VAH, mania, or paranoia.    At this time medications not adjusted.  He will follow-up with outpatient counseling for therapy.  No other concerns at this time    ICD-10-CM   1. Generalized anxiety disorder  F41.1 FLUoxetine (PROZAC) 40 MG capsule    2. Moderate recurrent major depression (HCC)  F33.1 FLUoxetine (PROZAC) 40 MG capsule      Past Psychiatric History: Depression, anxiety, SI, PTSD  Past Medical History:  Past Medical History:  Diagnosis Date   Anxiety    Back pain, chronic    Depression    Hypertension    UTI (lower urinary tract infection)    History reviewed. No pertinent surgical history.  Family Psychiatric History: Mother depression  Family History: History reviewed. No pertinent family history.  Social History:  Social History   Socioeconomic History   Marital status: Single    Spouse name: Not on file    Number of children: Not on file   Years of education: Not on file   Highest education level: Not on file  Occupational History   Not on file  Tobacco Use   Smoking status: Never   Smokeless tobacco: Never  Substance and Sexual Activity   Alcohol use: No   Drug use: No   Sexual activity: Not on file  Other Topics Concern   Not on file  Social History Narrative   Not on file   Social Determinants of Health   Financial Resource Strain: Not on file  Food Insecurity: Not on file  Transportation Needs: Not on file  Physical Activity: Not on file  Stress: Not on file  Social Connections: Not on file    Allergies:  Allergies  Allergen Reactions   Bee Venom Anaphylaxis   Benadryl Allergy [Diphenhydramine] Anaphylaxis    Metabolic Disorder Labs: Lab Results  Component Value Date   HGBA1C 8.0 (H) 11/17/2020   MPG 182.9 11/17/2020   MPG 159.94 05/13/2020   Lab Results  Component Value Date   PROLACTIN 8.6 11/17/2020   PROLACTIN QUANTITY NOT SUFFICIENT, UNABLE TO PERFORM TEST 05/13/2020   Lab Results  Component Value Date   CHOL 236 (H) 11/17/2020   TRIG 116 11/17/2020   HDL 60 11/17/2020   CHOLHDL 3.9 11/17/2020   VLDL 23 11/17/2020   LDLCALC 153 (H) 11/17/2020   LDLCALC 92 05/13/2020   Lab Results  Component Value Date  TSH 0.764 11/17/2020   TSH 1.528 05/13/2020    Therapeutic Level Labs: No results found for: "LITHIUM" No results found for: "VALPROATE" No results found for: "CBMZ"  Current Medications: Current Outpatient Medications  Medication Sig Dispense Refill   acetaminophen (TYLENOL) 500 MG tablet Take 1,000 mg by mouth every 6 (six) hours as needed for headache.     aspirin 81 MG EC tablet Take 81 mg by mouth daily.     atorvastatin (LIPITOR) 20 MG tablet Take 20 mg by mouth daily.     EPINEPHrine 0.3 mg/0.3 mL IJ SOAJ injection Inject 0.3 mg into the muscle as needed for anaphylaxis.     FLUoxetine (PROZAC) 40 MG capsule Take 1 capsule (40 mg  total) by mouth daily. 30 capsule 3   JARDIANCE 10 MG TABS tablet Take 10 mg by mouth daily.     lisinopril-hydrochlorothiazide (ZESTORETIC) 20-12.5 MG tablet Take 1 tablet by mouth daily.     OZEMPIC, 0.25 OR 0.5 MG/DOSE, 2 MG/1.5ML SOPN Inject 0.3 mg into the skin every Friday.     tiZANidine (ZANAFLEX) 2 MG tablet Take 2 mg by mouth 3 (three) times daily as needed for muscle spasms.     traZODone (DESYREL) 50 MG tablet Take 1 tablet (50 mg total) by mouth at bedtime as needed for sleep. 30 tablet 3   TRESIBA FLEXTOUCH 100 UNIT/ML FlexTouch Pen Inject 20 Units into the skin daily.     No current facility-administered medications for this visit.     Musculoskeletal: Strength & Muscle Tone: within normal limits Gait & Station: normal Patient leans: N/A  Psychiatric Specialty Exam: Review of Systems  There were no vitals taken for this visit.There is no height or weight on file to calculate BMI.  General Appearance: Well Groomed  Eye Contact:  Good  Speech:  Clear and Coherent and Normal Rate  Volume:  Normal  Mood:  Euthymic  Affect:  Appropriate and Congruent  Thought Process:  Coherent, Goal Directed, and Linear  Orientation:  Full (Time, Place, and Person)  Thought Content: WDL and Logical   Suicidal Thoughts:  No  Homicidal Thoughts:  No  Memory:  Immediate;   Good Recent;   Good Remote;   Good  Judgement:  Good  Insight:  Good  Psychomotor Activity:  Normal  Concentration:  Concentration: Good and Attention Span: Good  Recall:  Good  Fund of Knowledge: Good  Language: Good  Akathisia:  No  Handed:  Right  AIMS (if indicated): not done  Assets:  Communication Skills Desire for Improvement Financial Resources/Insurance Housing Physical Health Social Support  ADL's:  Intact  Cognition: WNL  Sleep:  Good   Screenings: GAD-7    Flowsheet Row Clinical Support from 07/19/2021 in [facility] Counselor from 07/10/2021 in [facility] Clinical Support from 11/17/2020 in [facility] Counselor from 09/12/2020 in [facility] Clinical Support from 08/16/2020 in [facility]  Total GAD-7 Score 9 11 17 16 16       PHQ2-9    Flowsheet Row Clinical Support from 07/19/2021 in [facility] Counselor from 07/10/2021 in [facility] Clinical Support from 11/17/2020 in [facility] Counselor from 09/12/2020 in [facility] Clinical Support from 08/16/2020 in [facility]  PHQ-2 Total Score 5 2 6 4 4   PHQ-9 Total Score 11 11 21  19  74      Flowsheet Row ED from 04/25/2021 in [facility] EMERGENCY DEPARTMENT Most recent reading at 04/25/2021 12:44 PM ED from 11/17/2020 in [facility] Most recent reading at 11/17/2020  5:11 PM Clinical Support from 11/17/2020 in [facility] Most recent reading at 11/17/2020 11:17 AM  C-SSRS RISK CATEGORY No Risk Error: Q3, 4, or 5 should not be populated when Q2 is No Error: Q7 should not be populated when Q6 is No        Assessment and Plan: Patient reports that he is doing well on his current medication regimen.  No medication changes made today.  Patient agreeable to continue medications as prescribed.  1. Generalized anxiety disorder  Continue- FLUoxetine (PROZAC) 40 MG capsule; Take 1 capsule (40 mg total) by mouth daily.  Dispense: 30 capsule; Refill: 3  2. Moderate recurrent major depression (HCC)  Continue- FLUoxetine (PROZAC) 40 MG capsule; Take 1 capsule (40 mg total) by mouth daily.  Dispense: 30 capsule; Refill: 3    Follow-up in 3 months Follow-up with therapy  Shanna Cisco, NP 07/19/2021, 1:55 PM

## 2021-07-31 ENCOUNTER — Ambulatory Visit (INDEPENDENT_AMBULATORY_CARE_PROVIDER_SITE_OTHER): Payer: Medicare Other | Admitting: Clinical

## 2021-07-31 DIAGNOSIS — F411 Generalized anxiety disorder: Secondary | ICD-10-CM | POA: Diagnosis not present

## 2021-07-31 NOTE — Progress Notes (Signed)
   THERAPIST PROGRESS NOTE  Session Time: 45 minutes  Participation Level: Active  Behavioral Response: CasualAlertEuthymic  Type of Therapy: Individual Therapy  Treatment Goals addressed: client will score less than a 10 on the PHQ-9  ProgressTowards Goals: Progressing  Interventions: CBT and Supportive  Summary:  Lance Chapman is a 56 y.o. male who presents for the presented for the scheduled session oriented times five, appropriately dressed, and friendly. Client denied hallucinations and delusions. Client reported on today he is doing fairly well. Client reported since he was last seen his mother has returned home from the nursing home. Client reported his mother had stomach bleeding form a ulcer and was hospitalized for a few days. Client reported his siblings felt that their mom should be back at home. Client reported he is resuming his responsibilities for caring for her. Client reported his siblings have come to town to help him. Client reported having a family meeting with his siblings this past week. Client reported they wanted to make sure their moms needs were being met wit his care or if other assistance was needed. Client reported while on the call with them his brother alleged that he, the client, did something inappropriate to their mom. Client reported his other siblings came to his defense to correct the negative accusation. Client reported otherwise he is managing well with the adjustment. Client reported he does have some stressors related to his phone bill which has doubled in cost. Client reported otherwise he is doing well. Evidence of progress towards goal:  client reported 1 coping skill is enjoying his free moments to listen to jazz and r&b music. Client reported at least 2 days per week to walk or doing home workouts.     Suicidal/Homicidal: Nowithout intent/plan  Therapist Response:  Therapist began the appointment asking the client how he has been doing since  last seen. Therapist used CBT to engage using active listening and positive emotional support. Therapist used CBT to engage and ask the client to discuss changes to his home environment that has affected his mood. Therapist used CBT to ask the client how he manages to problem solve with interpersonal stressors. Therapist used CBT to talk with the client about assertive communication,boundaries, and self care. Therapist used CBT ask the client to identify his progress with frequency of use with coping skills with continued practice in his daily activity.    Therapist assigned the client to practice self care.   Plan: Return again in 5 weeks.  Diagnosis: generalized anxiety disorder  Collaboration of Care: Patient refused AEB none requested by the client.  Patient/Guardian was advised Release of Information must be obtained prior to any record release in order to collaborate their care with an outside provider. Patient/Guardian was advised if they have not already done so to contact the registration department to sign all necessary forms in order for us to release information regarding their care.   Consent: Patient/Guardian gives verbal consent for treatment and assignment of benefits for services provided during this visit. Patient/Guardian expressed understanding and agreed to proceed.   Paige Y Cozart, LCSW 07/31/2021  

## 2021-07-31 NOTE — Plan of Care (Signed)
  Problem: Depression CCP Problem  1 coping skills Goal: LTG: Jeremiah WILL SCORE LESS THAN 10 ON THE PATIENT HEALTH QUESTIONNAIRE (PHQ-9) Outcome: Progressing Goal: STG: Kedric WILL IDENTIFY 3 COGNITIVE PATTERNS AND BELIEFS THAT SUPPORT DEPRESSION Outcome: Progressing   

## 2021-08-05 NOTE — Plan of Care (Signed)
  Problem: Depression CCP Problem  1 coping skills Goal: LTG: Delbert WILL SCORE LESS THAN 10 ON THE PATIENT HEALTH QUESTIONNAIRE (PHQ-9) Outcome: Progressing Goal: STG: Kjell WILL IDENTIFY 3 COGNITIVE PATTERNS AND BELIEFS THAT SUPPORT DEPRESSION Outcome: Progressing   

## 2021-08-13 ENCOUNTER — Emergency Department (HOSPITAL_COMMUNITY): Payer: Medicare HMO

## 2021-08-13 ENCOUNTER — Emergency Department (HOSPITAL_COMMUNITY)
Admission: EM | Admit: 2021-08-13 | Discharge: 2021-08-13 | Disposition: A | Discharge status: Home / Self Care | Payer: Medicare HMO | Attending: Emergency Medicine | Admitting: Emergency Medicine

## 2021-08-13 ENCOUNTER — Encounter (HOSPITAL_COMMUNITY): Payer: Self-pay

## 2021-08-13 ENCOUNTER — Other Ambulatory Visit: Payer: Self-pay

## 2021-08-13 DIAGNOSIS — Z7982 Long term (current) use of aspirin: Secondary | ICD-10-CM | POA: Diagnosis not present

## 2021-08-13 DIAGNOSIS — Z20822 Contact with and (suspected) exposure to covid-19: Secondary | ICD-10-CM | POA: Diagnosis not present

## 2021-08-13 DIAGNOSIS — R29810 Facial weakness: Secondary | ICD-10-CM | POA: Diagnosis not present

## 2021-08-13 DIAGNOSIS — I444 Left anterior fascicular block: Secondary | ICD-10-CM | POA: Insufficient documentation

## 2021-08-13 DIAGNOSIS — Z79899 Other long term (current) drug therapy: Secondary | ICD-10-CM | POA: Diagnosis not present

## 2021-08-13 DIAGNOSIS — I6782 Cerebral ischemia: Secondary | ICD-10-CM | POA: Diagnosis not present

## 2021-08-13 DIAGNOSIS — E236 Other disorders of pituitary gland: Secondary | ICD-10-CM | POA: Diagnosis not present

## 2021-08-13 DIAGNOSIS — E119 Type 2 diabetes mellitus without complications: Secondary | ICD-10-CM | POA: Insufficient documentation

## 2021-08-13 DIAGNOSIS — I10 Essential (primary) hypertension: Secondary | ICD-10-CM | POA: Diagnosis not present

## 2021-08-13 LAB — CBG MONITORING, ED: Glucose-Capillary: 210 mg/dL — ABNORMAL HIGH (ref 70–99)

## 2021-08-13 LAB — CBC
HCT: 43.7 % (ref 39.0–52.0)
Hemoglobin: 15 g/dL (ref 13.0–17.0)
MCH: 30.2 pg (ref 26.0–34.0)
MCHC: 34.3 g/dL (ref 30.0–36.0)
MCV: 87.9 fL (ref 80.0–100.0)
Platelets: 275 10*3/uL (ref 150–400)
RBC: 4.97 MIL/uL (ref 4.22–5.81)
RDW: 11.6 % (ref 11.5–15.5)
WBC: 9 10*3/uL (ref 4.0–10.5)
nRBC: 0 % (ref 0.0–0.2)

## 2021-08-13 LAB — DIFFERENTIAL
Abs Immature Granulocytes: 0.02 10*3/uL (ref 0.00–0.07)
Basophils Absolute: 0 10*3/uL (ref 0.0–0.1)
Basophils Relative: 0 %
Eosinophils Absolute: 0.1 10*3/uL (ref 0.0–0.5)
Eosinophils Relative: 1 %
Immature Granulocytes: 0 %
Lymphocytes Relative: 20 %
Lymphs Abs: 1.8 10*3/uL (ref 0.7–4.0)
Monocytes Absolute: 0.5 10*3/uL (ref 0.1–1.0)
Monocytes Relative: 6 %
Neutro Abs: 6.6 10*3/uL (ref 1.7–7.7)
Neutrophils Relative %: 73 %

## 2021-08-13 LAB — COMPREHENSIVE METABOLIC PANEL
ALT: 37 U/L (ref 0–44)
AST: 32 U/L (ref 15–41)
Albumin: 4.1 g/dL (ref 3.5–5.0)
Alkaline Phosphatase: 80 U/L (ref 38–126)
Anion gap: 7 (ref 5–15)
BUN: 19 mg/dL (ref 6–20)
CO2: 27 mmol/L (ref 22–32)
Calcium: 9.6 mg/dL (ref 8.9–10.3)
Chloride: 101 mmol/L (ref 98–111)
Creatinine, Ser: 1.39 mg/dL — ABNORMAL HIGH (ref 0.61–1.24)
GFR, Estimated: 59 mL/min — ABNORMAL LOW (ref >60)
Glucose, Bld: 200 mg/dL — ABNORMAL HIGH (ref 70–99)
Potassium: 3.9 mmol/L (ref 3.5–5.1)
Sodium: 135 mmol/L (ref 135–145)
Total Bilirubin: 0.7 mg/dL (ref 0.3–1.2)
Total Protein: 7.2 g/dL (ref 6.5–8.1)

## 2021-08-13 LAB — RAPID URINE DRUG SCREEN, HOSP PERFORMED
Amphetamines: NOT DETECTED
Barbiturates: NOT DETECTED
Benzodiazepines: NOT DETECTED
Cocaine: NOT DETECTED
Opiates: NOT DETECTED
Tetrahydrocannabinol: NOT DETECTED

## 2021-08-13 LAB — URINALYSIS, ROUTINE W REFLEX MICROSCOPIC
Bacteria, UA: NONE SEEN
Bilirubin Urine: NEGATIVE
Glucose, UA: >=500 mg/dL — AB
Hgb urine dipstick: NEGATIVE
Ketones, ur: NEGATIVE mg/dL
Leukocytes,Ua: NEGATIVE
Nitrite: NEGATIVE
Protein, ur: NEGATIVE mg/dL
Specific Gravity, Urine: 1.023 (ref 1.005–1.030)
pH: 6 (ref 5.0–8.0)

## 2021-08-13 LAB — APTT: aPTT: 25 seconds (ref 24–36)

## 2021-08-13 LAB — ETHANOL: Alcohol, Ethyl (B): <10 mg/dL (ref <10)

## 2021-08-13 LAB — RESP PANEL BY RT-PCR (FLU A&B, COVID) ARPGX2
Influenza A by PCR: NEGATIVE
Influenza B by PCR: NEGATIVE
SARS Coronavirus 2 by RT PCR: NEGATIVE

## 2021-08-13 LAB — PROTIME-INR
INR: 1.1 (ref 0.8–1.2)
Prothrombin Time: 13.6 seconds (ref 11.4–15.2)

## 2021-08-13 MED ORDER — VALACYCLOVIR HCL 1 G PO TABS: 1000.0000 mg | ORAL_TABLET | Freq: Three times a day (TID) | ORAL | 0 refills | Status: AC

## 2021-08-13 MED ORDER — PREDNISONE 50 MG PO TABS: 50.0000 mg | ORAL_TABLET | Freq: Every day | ORAL | 0 refills | Status: AC

## 2021-08-13 NOTE — ED Triage Notes (Addendum)
Pt coming from home via [facility]. Pt reports yesterday at 1500 he had an onset of right facial droop, decreased sensation on right side of his face. Pt reports headache 8/10 on the right side of his face and a vibration feeling in his right ear. 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 No previous stroke hx, hx of diabetes.   BP 160/100 HR 70 CBG 336 SPO2 99% room air

## 2021-08-13 NOTE — ED Notes (Signed)
Pt off floor for MRI

## 2021-08-13 NOTE — ED Provider Notes (Signed)
[facility] EMERGENCY DEPARTMENT Provider Note   CSN: [id number] Arrival date & time: 08/13/21  1648    History  Right facial droop  Lance Chapman is a 56 y.o. male she of hypertension, diabetes, congenital right eye blindness here for evaluation of right-sided facial droop and decree sensation right lower face.  Began yesterday around 3 PM, greater than 24 hours PTA.  No numbness, weakness to bilateral upper and lower extremities.  No difficulty with word finding.  Is a slight headache.  No prior history of stroke.  No lesions to his face.  No recent illnesses, change in vision, CP, SOB, tinnitus.   HPI     Home Medications Prior to Admission medications   Medication Sig Start Date End Date Taking? Authorizing Provider  predniSONE (DELTASONE) 50 MG tablet Take 1 tablet (50 mg total) by mouth daily for 5 days. 08/13/21 08/18/21 Yes Kinberly Perris A, PA-C  valACYclovir (VALTREX) 1000 MG tablet Take 1 tablet (1,000 mg total) by mouth 3 (three) times daily. 08/13/21  Yes Laynee Lockamy A, PA-C  acetaminophen (TYLENOL) 500 MG tablet Take 1,000 mg by mouth every 6 (six) hours as needed for headache.    [provider]  aspirin 81 MG EC tablet Take 81 mg by mouth daily. 09/22/20 09/22/21  [provider]  atorvastatin (LIPITOR) 20 MG tablet Take 20 mg by mouth daily. 02/05/19   [provider]  EPINEPHrine 0.3 mg/0.3 mL IJ SOAJ injection Inject 0.3 mg into the muscle as needed for anaphylaxis. 10/27/14   [provider]  FLUoxetine (PROZAC) 40 MG capsule Take 1 capsule (40 mg total) by mouth daily. 07/19/21   Shanna Cisco, NP  JARDIANCE 10 MG TABS tablet Take 10 mg by mouth daily. 05/09/20   [provider]  lisinopril-hydrochlorothiazide (ZESTORETIC) 20-12.5 MG tablet Take 1 tablet by mouth daily.    [provider]  OZEMPIC, 0.25 OR 0.5 MG/DOSE, 2 MG/1.5ML SOPN Inject 0.3 mg into the skin every Friday. 03/09/20    [provider]  tiZANidine (ZANAFLEX) 2 MG tablet Take 2 mg by mouth 3 (three) times daily as needed for muscle spasms. 07/07/20   [provider]  traZODone (DESYREL) 50 MG tablet Take 1 tablet (50 mg total) by mouth at bedtime as needed for sleep. 07/19/21   Shanna Cisco, NP  TRESIBA FLEXTOUCH 100 UNIT/ML FlexTouch Pen Inject 20 Units into the skin daily. 06/11/19   [provider]      Allergies    Bee venom and Benadryl allergy [diphenhydramine]    Review of Systems   Review of Systems  Constitutional: Negative.   HENT: Negative.    Respiratory: Negative.    Cardiovascular: Negative.   Gastrointestinal: Negative.   Genitourinary: Negative.   Musculoskeletal: Negative.   Skin: Negative.   Neurological:  Positive for facial asymmetry. Negative for dizziness, tremors, seizures, syncope, speech difficulty, weakness, light-headedness, numbness and headaches.  All other systems reviewed and are negative.   Physical Exam Updated Vital Signs BP 122/80   Pulse 62   Temp 97.9 F (36.6 C) (Oral)   Resp (!) 23   Ht 5\' 8"  (1.727 m)   Wt 86.2 kg   SpO2 99%   BMI 28.89 kg/m  Physical Exam Vitals and nursing note reviewed.  Constitutional:      General: He is not in acute distress.    Appearance: He is well-developed. He is not ill-appearing, toxic-appearing or diaphoretic.  HENT:  Head: Normocephalic and atraumatic.     Comments: Right lower facial droop.  Lifts bilateral eyebrows without difficulty    Ears:     Comments: Cerumen bilaterally, dried skin to outer ear no lesions or vesicles Eyes:     Extraocular Movements: Extraocular movements intact.     Conjunctiva/sclera: Conjunctivae normal.     Pupils: Pupils are equal, round, and reactive to light.     Comments: PERRLA Difficulty with right eye ROM since birth Right upper eye lid fullness,possible droop? Unclear if chronic  Neck:     Trachea: Trachea and phonation normal.     Comments:  Full ROM Cardiovascular:     Rate and Rhythm: Normal rate and regular rhythm.     Pulses: Normal pulses.     Heart sounds: Normal heart sounds.  Pulmonary:     Effort: Pulmonary effort is normal. No respiratory distress.     Breath sounds: Normal breath sounds.  Abdominal:     General: Bowel sounds are normal. There is no distension.     Palpations: Abdomen is soft.  Musculoskeletal:        General: No swelling, tenderness, deformity or signs of injury. Normal range of motion.     Cervical back: Full passive range of motion without pain, normal range of motion and neck supple.     Right lower leg: No edema.     Left lower leg: No edema.  Skin:    General: Skin is warm and dry.     Capillary Refill: Capillary refill takes less than 2 seconds.  Neurological:     General: No focal deficit present.     Mental Status: He is alert and oriented to person, place, and time.     Sensory: Sensation is intact.     Motor: Motor function is intact.     Coordination: Coordination is intact.     Gait: Gait is intact.     Comments: Right lower facial droop    ED Results / Procedures / Treatments   Labs (all labs ordered are listed, but only abnormal results are displayed) Labs Reviewed  COMPREHENSIVE METABOLIC PANEL - Abnormal; Notable for the following components:      Result Value   Glucose, Bld 200 (*)    Creatinine, Ser 1.39 (*)    GFR, Estimated 59 (*)    All other components within normal limits  URINALYSIS, ROUTINE W REFLEX MICROSCOPIC - Abnormal; Notable for the following components:   Color, Urine STRAW (*)    Glucose, UA >=500 (*)    All other components within normal limits  CBG MONITORING, ED - Abnormal; Notable for the following components:   Glucose-Capillary 210 (*)    All other components within normal limits  RESP PANEL BY RT-PCR (FLU A&B, COVID) ARPGX2  ETHANOL  PROTIME-INR  APTT  CBC  DIFFERENTIAL  RAPID URINE DRUG SCREEN, HOSP PERFORMED    EKG EKG  Interpretation  Date/Time:  Sunday August 13 2021 16:56:10 EDT Ventricular Rate:  63 PR Interval:  169 QRS Duration: 109 QT Interval:  399 QTC Calculation: 409 R Axis:   -67 Text Interpretation: Sinus rhythm Left anterior fascicular block No significant change since last tracing Confirmed by Cathren Laine ([id number]) on 08/13/2021 5:08:57 PM  Radiology CT HEAD WO CONTRAST  Result Date: 08/13/2021 CLINICAL DATA:  Neuro deficit, acute stroke suspected. EXAM: CT HEAD WITHOUT CONTRAST TECHNIQUE: Contiguous axial images were obtained from the base of the skull through the vertex without intravenous contrast. RADIATION  DOSE REDUCTION: This exam was performed according to the departmental dose-optimization program which includes automated exposure control, adjustment of the mA and/or kV according to patient size and/or use of iterative reconstruction technique. COMPARISON:  CT June 12, 2019. FINDINGS: Brain: No evidence of acute infarction, hemorrhage, hydrocephalus, extra-axial collection or mass lesion/mass effect. Vascular: No hyperdense vessel. Atherosclerotic calcifications of the internal carotid arteries at the skull base. Skull: Normal. Negative for fracture or focal lesion. Sinuses/Orbits: Paranasal sinuses are predominantly clear. Orbits are grossly unremarkable. Other: Probable cerumen in the external auditory canal on the right. Mastoid air cells are predominantly clear. IMPRESSION: No acute intracranial abnormality identified. Electronically Signed   By: Maudry Mayhew M.D.   On: 08/13/2021 17:22      CLINICAL DATA: Initial evaluation for neuro deficit, stroke suspected, right facial droop.  EXAM: MRI HEAD WITHOUT CONTRAST  TECHNIQUE: Multiplanar, multiecho pulse sequences of the brain and surrounding structures were obtained without intravenous contrast.  COMPARISON: Prior CT from earlier the same day.  FINDINGS: Brain: Cerebral volume within normal limits for age. Mild patchy FLAIR  signal abnormality involving the periventricular white matter, most consistent with chronic small vessel ischemic disease, mild for age.  No evidence for acute or subacute infarct. Gray-white matter differentiation maintained. No areas of chronic cortical infarction. No acute or chronic intracranial blood products.  No mass lesion, midline shift or mass effect. No hydrocephalus or extra-axial fluid collection.  Pituitary gland is prominent for age with convex border superiorly additionally, pituitary stalk is deviated to the left (series 17, image 19). Finding raises the possibility for a underlying pituitary lesion, although no discrete lesion is seen on this non pituitary protocol MRI.  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 Overall vertebrobasilar system is diminutive, likely based off fetal type origin of the PCAs. Major intracranial vascular flow voids maintained.  Skull and upper cervical spine: Craniocervical junction within normal limits. Bone marrow signal intensity normal. No scalp soft tissue abnormality.  Sinuses/Orbits: Globes orbital soft tissues within normal limits. Paranasal sinuses are largely clear. No mastoid effusion.  Other: None.  IMPRESSION: 1. No acute intracranial abnormality. 2. Mild chronic microvascular ischemic disease for age. 3. Prominent pituitary gland for age with convex border superiorly, with slight leftward deviation of the pituitary stalk. Finding raises the possibility for a underlying pituitary lesion, although no definite discrete lesion is seen on this non pituitary protocol MRI. Correlation with pituitary function tests suggested. Additionally, further evaluation with dedicated pituitary protocol MRI with and without contrast could be performed for further evaluation as clinically warranted. This could be performed on a nonemergent outpatient basis. He   Electronically Signed By: Rise Mu M.D. On: 08/13/2021 19:07    Procedures Procedures    Medications Ordered in ED Medications - No data to display  ED Course/ Medical Decision Making/ A&P    56 year old here for evaluation of right-sided facial droop that began greater than 24 hours PTA.  Associate decreased sensation to right side of face.  Only seem to involve the lower face however has right eye blindness with difficulties with eye on that side at baseline.  No slurred speech, no upper or lower extremity numbness or weakness.  Possible slight decreased in brow raise on right.  Plan on labs and imaging. Not a code stroke due to sx > 24 hours. No LVO criteria  Labs and imaging personally viewed and interpreted:  CBC without leukocytosis UA neg for infection Anabolic panel glucose 200,  creatinine 1.39 at baseline UDS neg COVID, flu neg CT head without acute findings MR neg for CVA. Does show chronic pituitary recommends outpatient MRI pituitary protocol   Suspect likely incomplete Bell's palsy.  We will treat as such.  MRI reassuring without CVA, low suspicion for dissection, infection, mass, Ramsey hunt syndrome at this time.  Will start on steroids, Valtrex, encourage close monitoring of blood sugars at home given history of diabetes.  Also discussed his pituitary gland study need for follow-up needed in the outpatient setting.  He is agreeable.  The patient has been appropriately medically screened and/or stabilized in the ED. I have low suspicion for any other emergent medical condition which would require further screening, evaluation or treatment in the ED or require inpatient management.  Patient is hemodynamically stable and in no acute distress.  Patient able to ambulate in department prior to ED.  Evaluation does not show acute pathology that would require ongoing or additional emergent interventions while in the emergency department or further inpatient treatment.  I have discussed the diagnosis  with the patient and answered all questions.  Pain is been managed while in the emergency department and patient has no further complaints prior to discharge.  Patient is comfortable with plan discussed in room and is stable for discharge at this time.  I have discussed strict return precautions for returning to the emergency department.  Patient was encouraged to follow-up with PCP/specialist refer to at discharge.   Patient seen and evaluated by attending who is agreeable with above treatment, plan and disposition.                           Medical Decision Making Amount and/or Complexity of Data Reviewed Independent Historian:     Details: brother External Data Reviewed: labs, radiology, ECG and notes. Labs: ordered. Decision-making details documented in ED Course. Radiology: ordered and independent interpretation performed. Decision-making details documented in ED Course. ECG/medicine tests: ordered and independent interpretation performed. Decision-making details documented in ED Course.  Risk OTC drugs. Prescription drug management. Parenteral controlled substances. Decision regarding hospitalization. Diagnosis or treatment significantly limited by social determinants of health.         Final Clinical Impression(s) / ED Diagnoses Final diagnoses:  Facial droop  Pituitary gland enlarged (HCC)    Rx / DC Orders ED Discharge Orders          Ordered    predniSONE (DELTASONE) 50 MG tablet  Daily        08/13/21 2037    valACYclovir (VALTREX) 1000 MG tablet  3 times daily        08/13/21 2037              Toyia Jelinek A, PA-C 08/13/21 2047    Cathren Laine, MD 08/13/21 2300

## 2021-08-13 NOTE — Discharge Instructions (Addendum)
We suspect this is likely Bell's palsy as discussed in the room.  Sure to follow-up with your primary care provider for the pituitary gland study that we talked about. They should be able to see the results in their computer system  I have started you on a few medications.  As discussed if your blood sugars are greater than 250 at home stop taking the steroids

## 2021-08-21 ENCOUNTER — Telehealth (HOSPITAL_COMMUNITY): Payer: Self-pay | Admitting: Clinical

## 2021-08-21 NOTE — Telephone Encounter (Signed)
Therapist attempted to call the clients cell phone and home number to speak with him regarding the request to have his mychart revised with the therapy notes. Client did not answer at either number. Therapist was unable to leave a voice mail due to their being no voicemail box set up on either phone number.

## 2021-08-23 ENCOUNTER — Ambulatory Visit (INDEPENDENT_AMBULATORY_CARE_PROVIDER_SITE_OTHER): Payer: Medicare HMO | Admitting: Clinical

## 2021-08-23 DIAGNOSIS — F331 Major depressive disorder, recurrent, moderate: Secondary | ICD-10-CM | POA: Diagnosis not present

## 2021-08-23 NOTE — Progress Notes (Signed)
THERAPIST PROGRESS NOTE  Session Time: 40 minutes   Participation Level: Active  Behavioral Response: CasualAlertDepressed  Type of Therapy: Individual Therapy  Treatment Goals addressed: Client will identify 3 cognitive patterns and/or beliefs that support depression  ProgressTowards Goals: Progressing  Interventions: CBT and Supportive  Summary:  Lance Chapman is a 56 y.o. male who presents for walk-in therapy appointment.  Client presents oriented x5, appropriately dressed, and friendly.  Client denied hallucinations and delusions.  Client presents with his older brother who lives in [place].  Client did sign a release which is on file for his mother to be present for the appointment today. Client's brother reported he is presenting as a show support for his brother as well as behalf of the other siblings.  The clients brother reported they have had concerns about the client being scammed for the past few years. The brother showed receipt of a [employer] cell phone bill with a total of $7000 bill.  The brother reported that the client has been involved with different women over the years and has "squandered" away money. The clients brother reported 7 phones are on the clients bill and his previous girlfriend that he met online was posing as his wife for his own account. Client reported between the different medications he has been taking, caring for his mom and other stressors he did not realize that this was going on.  The clients brother reported the relationship is not always been the best but they are working on managing it.  Client's brother reported because the client is the youngest his childhood and look a lot different from his older siblings.  The clients brother reported their mother provided for them but she was not the best for positive reinforcement towards he client and feels that he has been deeply affected by that throughout his life.  The client reported a few years ago when  their mother was an early onset dementia she made a comment alluding to her not wanting the client as a baby.  The brother reported he feels that has also affected the client negatively.  Client reported approximately 2 weeks ago he was in the hospital due to having a facial droop.  Client reported on today he is still having some swelling in his face and has a follow-up appointment for CT scan soon. Evidence of progress towards goal:  client reported 1 pattern related to his thinking regarding dating/ interpersonal relationship that contribute to depression.   Suicidal/Homicidal: Nowithout intent/plan  Therapist Response:  Therapist began the appointment asking the client how he has been doing since last seen. Therapist used CBT to engage with the clients brother about collateral information about the clients home and family life. Therapist used CBT to actively listening and provide positive emotional support. Therapist used CBT to engage and ask open ended questions to the client about how he feels about the stressors presented in session today. Therapist used CBT to engage and reinforce being in support of normalizing his emotions, understanding his perspective and helping to problem solve.  Therapist used CBT ask the client to identify his progress with frequency of use with coping skills with continued practice in his daily activity.    Therapist assigned the client homework to practice self care.    Plan: Return again in 3 weeks.  Diagnosis: moderate episode of major depression  Collaboration of Care: Patient refused AEB none requested by the client.  Patient/Guardian was advised Release of Information must be obtained  prior to any record release in order to collaborate their care with an outside provider. Patient/Guardian was advised if they have not already done so to contact the registration department to sign all necessary forms in order for [place] to release information regarding their  care.   Consent: Patient/Guardian gives verbal consent for treatment and assignment of benefits for services provided during this visit. Patient/Guardian expressed understanding and agreed to proceed.   Hart, LCSW 08/23/2021

## 2021-08-23 NOTE — Plan of Care (Signed)
  Problem: Depression CCP Problem  1 coping skills Goal: STG: Lance Chapman WILL IDENTIFY 3 COGNITIVE PATTERNS AND BELIEFS THAT SUPPORT DEPRESSION Outcome: Progressing   Problem: Depression CCP Problem  1 coping skills Goal: LTG: Lance Chapman WILL SCORE LESS THAN 10 ON THE PATIENT HEALTH QUESTIONNAIRE (PHQ-9) Outcome: Not Progressing

## 2021-08-28 ENCOUNTER — Telehealth (HOSPITAL_COMMUNITY): Payer: Self-pay | Admitting: *Deleted

## 2021-08-28 NOTE — Telephone Encounter (Signed)
Patient informed Clinical research associate that recently he went to the ED due to facial drooping and was diagnosed with Bell's palsy.  He has a follow-up visit with his primary care doctor next week to address this.  At this time patient reports that he does not have questions or concerns for Clinical research associate.  He informed Clinical research associate that is his PCP needs documentations to fax it over.  Provider endorsed understanding and agreed.  No other concerns at this time.

## 2021-08-28 NOTE — Telephone Encounter (Signed)
Patient called requesting to speak with his Dr Toy Cookey -- stated that he has some questions about his medication

## 2021-09-14 ENCOUNTER — Ambulatory Visit (INDEPENDENT_AMBULATORY_CARE_PROVIDER_SITE_OTHER): Payer: Medicare HMO | Admitting: Clinical

## 2021-09-14 DIAGNOSIS — F331 Major depressive disorder, recurrent, moderate: Secondary | ICD-10-CM | POA: Diagnosis not present

## 2021-09-14 NOTE — Progress Notes (Unsigned)
   THERAPIST PROGRESS NOTE  Session Time: 45 minutes  Participation Level: Active  Behavioral Response: CasualAlertEuthymic  Type of Therapy: Individual Therapy  Treatment Goals addressed: client will identify 3 cognitive patterns and beliefs that support depression  ProgressTowards Goals: Progressing  Interventions: CBT and Supportive  Summary:  Lance Chapman is a 56 y.o. male who presents for the scheduled appointment oriented times five, appropriately dressed, and friendly. Client denied hallucinations and delusions. Client reported he is doing fairly well. Client reported he recently went out to dinner with his brother for his birthday and had a good time. Client reported otherwise his siblings have been talking to him about taking a break from looking after their mother. Client reported his siblings are willing to have someone go to the home to watch their mom while he goe son vacation. Client reported he is looking to do that soon. Client reported otherwise he finds himself thinking about the comment his mother made about not wanting him when she was pregnant with him. Client reported he has forgiven her on his own because holding a grudge would affect his ability to continue to care for her. Client recalled during childhood his mother would make comments to him that he didn't like her but that was not true. Client reported to everyone else she made things to appear nice. Client reported he understands his mother had a had upbringing and that affected how she was as a parent to them. Client reported otherwise he has an upcoming doctor appointment for lingering symptoms of droppy face. Client reported his family is helping him figure out how to pay off his phone bill. Client reported he has not had an contact with his previous online girlfriend. Client reported when he is ready to date again it will be in person with someone in his area.  Evidence of progress towards goal:  client reported  medication compliance 7 days per week. Client reported 1 negative cognitive pattern related to childhood experience and his mother that he has worked to process and forgive.   Suicidal/Homicidal: Nowithout intent/plan  Therapist Response:  Therapist began the appointment asking the client how he has been doing since last seen. Therapist used CBT to engage using active listening and positive emotional support. Therapist used CBT to allow the client time to discuss his thoughts and feelings. Therapist used CBT to engage the client in discussion about how he is processing his recurrent negative thoughts from childhood. Therapist used CBT to give positive reinforcement for his effort to process changes positively. Therapist used CBT ask the client to identify her progress with frequency of use with coping skills with continued practice in her daily activity.    Therapist assigned the client homework to practice self care.   Plan: Return again in 4 weeks.  Diagnosis: moderate recurrent major depression  Collaboration of Care: Patient refused AEB none requested by the client.  Patient/Guardian was advised Release of Information must be obtained prior to any record release in order to collaborate their care with an outside provider. Patient/Guardian was advised if they have not already done so to contact the registration department to sign all necessary forms in order for [place] to release information regarding their care.   Consent: Patient/Guardian gives verbal consent for treatment and assignment of benefits for services provided during this visit. Patient/Guardian expressed understanding and agreed to proceed.   Neena Rhymes Dominigue Gellner, LCSW 09/14/2021

## 2021-09-16 NOTE — Plan of Care (Signed)
  Problem: Depression CCP Problem  1 coping skills Goal: LTG: Lance Chapman WILL SCORE LESS THAN 10 ON THE PATIENT HEALTH QUESTIONNAIRE (PHQ-9) Outcome: Progressing Goal: STG: Lance Chapman WILL IDENTIFY 3 COGNITIVE PATTERNS AND BELIEFS THAT SUPPORT DEPRESSION Outcome: Progressing

## 2021-09-28 ENCOUNTER — Ambulatory Visit (INDEPENDENT_AMBULATORY_CARE_PROVIDER_SITE_OTHER): Payer: Medicare HMO | Admitting: Clinical

## 2021-09-28 DIAGNOSIS — F331 Major depressive disorder, recurrent, moderate: Secondary | ICD-10-CM | POA: Diagnosis not present
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
## 2021-09-28 NOTE — Plan of Care (Signed)
  Problem: Depression CCP Problem  1 coping skills Goal: LTG: Nicco WILL SCORE LESS THAN 10 ON THE PATIENT HEALTH QUESTIONNAIRE (PHQ-9) Outcome: Progressing Goal: STG: Luisfernando WILL IDENTIFY 3 COGNITIVE PATTERNS AND BELIEFS THAT SUPPORT DEPRESSION Outcome: Progressing   

## 2021-10-16 ENCOUNTER — Ambulatory Visit (HOSPITAL_COMMUNITY): Payer: Medicare HMO | Admitting: Clinical

## 2021-10-17 NOTE — Progress Notes (Signed)
[facility] Outpatient Progress Note  10/24/2021 9:26 PM Lance Chapman  MRN:  [id number]  Assessment:  56 year old male seen today for follow up psychiatric evaluation. He has a psychiatric history of anxiety, depression, SI, and PTSD. He is currently managed on Prozac 40 mg daily and Trazodone 50 mg nightly as needed. He notes his medications are effective in managing his psychiatric conditions.     Identifying Information: Lance Chapman is a 56 y.o. y.o. male with a history of GAD, MDD who is an established patient with [facility] for medication management.   Plan:  # Generalized Anxiety Disorder Past medication trials: improving Status of problem: Improving Interventions: -- INCREASE prozac 40 to 60 mg mg for depression and anxiety -- Continue trazodone 50 mg -- Continue psychotherapy  # Major depressive Disorder-moderate Past medication trials:  Status of problem: improving Interventions: -- Continue prozac and trazodone as above -- Continue psychotherapy  #Caregiver Burden -Provided patient with resources in order to assist with patient taking care of mother  Patient was given contact information for behavioral health clinic and was instructed to call 911 for emergencies.   Subjective:  Chief Complaint:  Chief Complaint  Patient presents with   Depression    Interval History:  Patient continues to take care of 95 yo mother with dementia. Continues to have difficulty with mother becoming acutely confused every other night due to mother attempting to look for her sister who died ~20 years ago. Patient still under great deal of stress and is hopeful that siblings will be able to spend some time to take care of mother while he takes care of himself. Patient also endorses other stress including being in a virtual relationship, needing to get a new cellphone, needing to get healthcare card to pay for medication and groceries, not sleeping well (~4 hours  per night).  Patient has been doing his best manage all these stressors and does have good coping skills that he is working with therapist to improve. Patient denies present SI/HI/AVH. Patient does continue to endorse anhedonia, depressed mood, insomnia, poor appetite, guilt, difficulty concentrating.   Patient mentions an incident a few months ago where he impulsively bought multiple electronics that was outside of his budget. Patient states it happened once and he never had an impulsive purchase or any risky behaviors before or after this incident. Patient denies ever being this impulsive. While patient has been having difficulty with sleep, he reports he does regularly sleep and denies ever having symptoms of mania.   Patient agreeable to increase prozac to 60 mg for continued depressed symptoms. Patient to follow up in 3 months.   Patient requesting medical records of his psychiatric and therapy visits, so I directed him to speak with medical records for this information.   Visit Diagnosis:    ICD-10-CM   1. Moderate recurrent major depression (HCC)  F33.1 traZODone (DESYREL) 50 MG tablet    FLUoxetine (PROZAC) 20 MG capsule    2. Generalized anxiety disorder  F41.1     3. Caregiver burden  Z63.6       Past Psychiatric History:  Depression, anxiety, SI, PTSD  Past Medical History:  Past Medical History:  Diagnosis Date   Anxiety    Back pain, chronic    Depression    Hypertension    UTI (lower urinary tract infection)    History reviewed. No pertinent surgical history.  Family Psychiatric History: mother depression  Family History: History reviewed. No pertinent  family history.  Social History:  Social History   Socioeconomic History   Marital status: Single    Spouse name: Not on file   Number of children: Not on file   Years of education: Not on file   Highest education level: Not on file  Occupational History   Not on file  Tobacco Use   Smoking status: Never    Smokeless tobacco: Never  Substance and Sexual Activity   Alcohol use: No   Drug use: No   Sexual activity: Not on file  Other Topics Concern   Not on file  Social History Narrative   Not on file   Social Determinants of Health   Financial Resource Strain: Not on file  Food Insecurity: Not on file  Transportation Needs: Not on file  Physical Activity: Not on file  Stress: Not on file  Social Connections: Not on file    Allergies:  Allergies  Allergen Reactions   Bee Venom Anaphylaxis   Benadryl Allergy [Diphenhydramine] Anaphylaxis    Current Medications: Current Outpatient Medications  Medication Sig Dispense Refill   FLUoxetine (PROZAC) 20 MG capsule Take 3 capsules (60 mg total) by mouth daily. 30 capsule 3   acetaminophen (TYLENOL) 500 MG tablet Take 1,000 mg by mouth every 6 (six) hours as needed for headache.     atorvastatin (LIPITOR) 20 MG tablet Take 20 mg by mouth daily.     EPINEPHrine 0.3 mg/0.3 mL IJ SOAJ injection Inject 0.3 mg into the muscle as needed for anaphylaxis.     JARDIANCE 10 MG TABS tablet Take 10 mg by mouth daily.     lisinopril-hydrochlorothiazide (ZESTORETIC) 20-12.5 MG tablet Take 1 tablet by mouth daily.     OZEMPIC, 0.25 OR 0.5 MG/DOSE, 2 MG/1.5ML SOPN Inject 0.3 mg into the skin every Friday.     tiZANidine (ZANAFLEX) 2 MG tablet Take 2 mg by mouth 3 (three) times daily as needed for muscle spasms.     traZODone (DESYREL) 50 MG tablet Take 1 tablet (50 mg total) by mouth at bedtime as needed for sleep. 30 tablet 3   TRESIBA FLEXTOUCH 100 UNIT/ML FlexTouch Pen Inject 20 Units into the skin daily.     valACYclovir (VALTREX) 1000 MG tablet Take 1 tablet (1,000 mg total) by mouth 3 (three) times daily. 21 tablet 0   No current facility-administered medications for this visit.    ROS: Review of Systems  Objective:  Psychiatric Specialty Exam: There were no vitals taken for this visit.There is no height or weight on file to calculate  BMI.  General Appearance: Casual  Eye Contact:  Good  Speech:  Clear and Coherent and Normal Rate  Volume:  Normal  Mood:  Euthymic  Affect:  Constricted  Thought Process:  Coherent, Goal Directed, and Linear  Orientation:  Full (Time, Place, and Person)  Thought Content: Logical   Suicidal Thoughts:  No  Homicidal Thoughts:  No  Memory:  Negative  Judgment:  Fair  Insight:  Fair  Psychomotor Activity:  Normal  Concentration:  Concentration: Good and Attention Span: Good  Recall:  Good  Fund of Knowledge: Good  Language: Good  Akathisia:  Negative    AIMS (if indicated): not done  Assets:  Communication Skills Desire for Improvement Housing Intimacy Leisure Time Physical Health Resilience Social Support  ADL's:  Intact  Cognition: WNL  Sleep:  Poor   PE: General: well-appearing; no acute distress  Pulm: no increased work of breathing on room air  Strength & Muscle Tone: within normal limits Neuro: no focal neurological deficits observed  Gait & Station: normal  Metabolic Disorder Labs: Lab Results  Component Value Date   HGBA1C 8.0 (H) 11/17/2020   MPG 182.9 11/17/2020   MPG 159.94 05/13/2020   Lab Results  Component Value Date   PROLACTIN 8.6 11/17/2020   PROLACTIN QUANTITY NOT SUFFICIENT, UNABLE TO PERFORM TEST 05/13/2020   Lab Results  Component Value Date   CHOL 236 (H) 11/17/2020   TRIG 116 11/17/2020   HDL 60 11/17/2020   CHOLHDL 3.9 11/17/2020   VLDL 23 11/17/2020   LDLCALC 153 (H) 11/17/2020   LDLCALC 92 05/13/2020   Lab Results  Component Value Date   TSH 0.764 11/17/2020   TSH 1.528 05/13/2020    Therapeutic Level Labs: No results found for: "LITHIUM" No results found for: "VALPROATE" No results found for: "CBMZ"  Screenings: GAD-7    Flowsheet Row Clinical Support from 07/19/2021 in [facility] Counselor from 07/10/2021 in [facility] from 11/17/2020 in  [facility] Counselor from 09/12/2020 in [facility] from 08/16/2020 in [facility]  Total GAD-7 Score 9 11 17 16 16       PHQ2-9    Flowsheet Row Clinical Support from 10/19/2021 in [facility] from 07/19/2021 in [facility] Counselor from 07/10/2021 in [place] from 11/17/2020 in [facility] Counselor from 09/12/2020 in [place]  PHQ-2 Total Score 5 5 2 6 4   PHQ-9 Total Score 15 11 11 21 19       [place] [facility] from 08/13/2021 in [facility] from 04/25/2021 in [place] [facility] from 11/17/2020 in [place] No Risk No Risk Error: Q3, 4, or 5 should not be populated when Q2 is No       Collaboration of Care: Collaboration of Care:   Patient/Guardian was advised Release of Information must be obtained prior to any record release in order to collaborate their care with an outside provider. Patient/Guardian was advised if they have not already done so to contact the registration department to sign all necessary forms in order for [place] to release information regarding their care.   Consent: Patient/Guardian gives verbal consent for treatment and assignment of benefits for services provided during this visit. Patient/Guardian expressed understanding and agreed to proceed.   A total of 30 minutes was spent involved in face to face clinical care, chart review, documentation.   France Ravens, MD 10/24/2021, 9:26 PM

## 2021-10-18 ENCOUNTER — Encounter (HOSPITAL_COMMUNITY): Payer: Medicare HMO | Admitting: Psychiatry

## 2021-10-19 ENCOUNTER — Encounter (HOSPITAL_COMMUNITY): Payer: Self-pay | Admitting: Student

## 2021-10-19 ENCOUNTER — Ambulatory Visit (INDEPENDENT_AMBULATORY_CARE_PROVIDER_SITE_OTHER): Payer: Medicare Other | Admitting: Student

## 2021-10-19 DIAGNOSIS — Z636 Dependent relative needing care at home: Secondary | ICD-10-CM

## 2021-10-19 DIAGNOSIS — F411 Generalized anxiety disorder: Secondary | ICD-10-CM

## 2021-10-19 DIAGNOSIS — F331 Major depressive disorder, recurrent, moderate: Secondary | ICD-10-CM | POA: Diagnosis not present

## 2021-10-19 MED ORDER — TRAZODONE HCL 50 MG PO TABS
50.0000 mg | ORAL_TABLET | Freq: Every evening | ORAL | 3 refills | Status: DC | PRN
Start: 2021-10-19 — End: 2022-10-23

## 2021-10-19 MED ORDER — FLUOXETINE HCL 20 MG PO CAPS: 60.0000 mg | ORAL_CAPSULE | Freq: Every day | ORAL | 3 refills | Status: DC

## 2021-10-19 NOTE — Patient Instructions (Addendum)
Dear Mr. Tutson,  It was a pleasure meeting you today. I have increased your prozac to 60 mg daily.  Report any adverse effects and or reactions from the medicines to your outpatient provider promptly. Do not engage in alcohol and or illegal drug use while on prescription medicines. In the event of worsening symptoms, call the crisis hotline, 911 and or go to the nearest ED for appropriate evaluation and treatment of symptoms. follow-up with your primary care provider for your other medical issues, concerns and or health care needs.  The following link helps provide assistance caregiver fatigue:  [email or web address]de  Please talk with your mom's primary care doctor to provide more resources for home healthcare if you feel it is necessary.  Please reach out to medical records to get a copy of your medical records.  Please follow up in 3 months.   Take care! -Dr. Lurline Hare

## 2021-10-31 ENCOUNTER — Ambulatory Visit (HOSPITAL_COMMUNITY): Payer: Medicare HMO | Admitting: Clinical

## 2021-11-09 ENCOUNTER — Encounter (HOSPITAL_COMMUNITY): Payer: Medicare HMO | Admitting: Student

## 2021-11-16 ENCOUNTER — Encounter (HOSPITAL_COMMUNITY): Payer: Medicare HMO | Admitting: Student

## 2021-12-28 ENCOUNTER — Encounter (HOSPITAL_COMMUNITY): Payer: Medicare HMO | Admitting: Student

## 2022-01-18 ENCOUNTER — Encounter (HOSPITAL_COMMUNITY): Payer: Medicare HMO | Admitting: Student

## 2022-01-19 IMAGING — US US RENAL
1 series · 14 of 25 positions shown · non-contrast
Comparison: None.

CLINICAL DATA: Stage III A kidney disease.

EXAM:
RENAL / URINARY TRACT ULTRASOUND COMPLETE

[Series 1: us renal · 0.20mm/px · 14 of 42 slices shown]
[im 1/42]
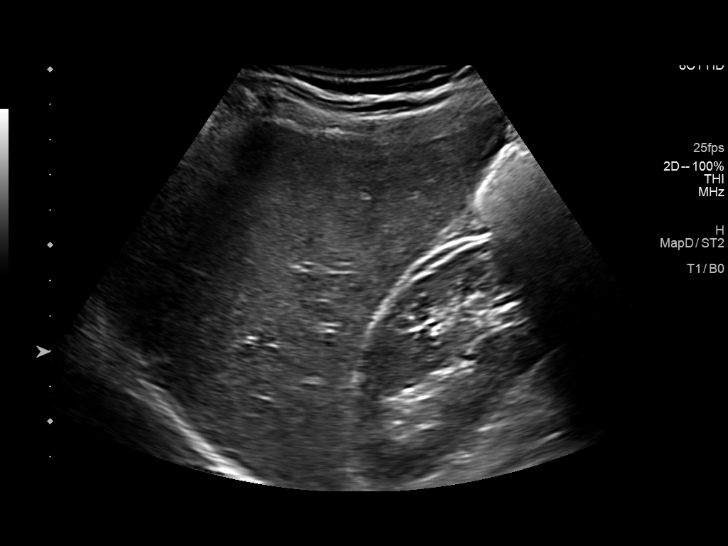
[im 4/42]
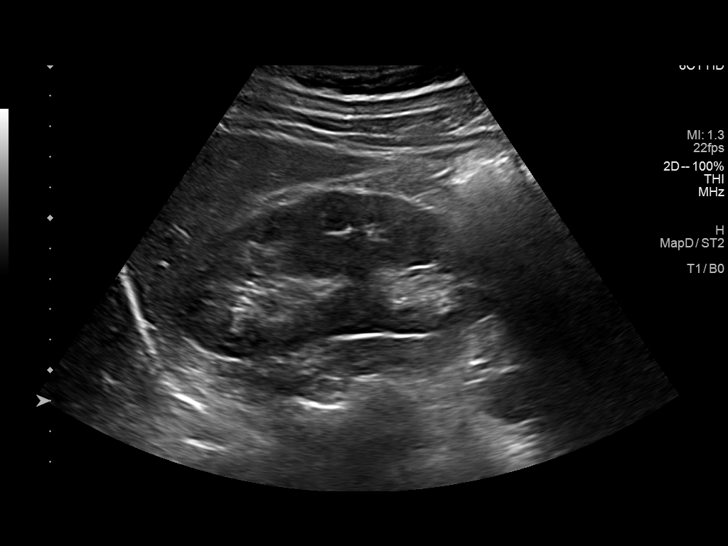
[im 7/42]
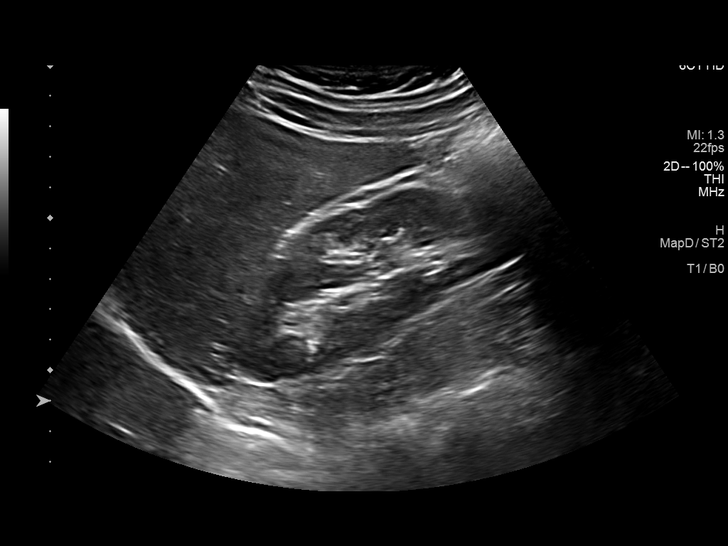
[im 11/42]
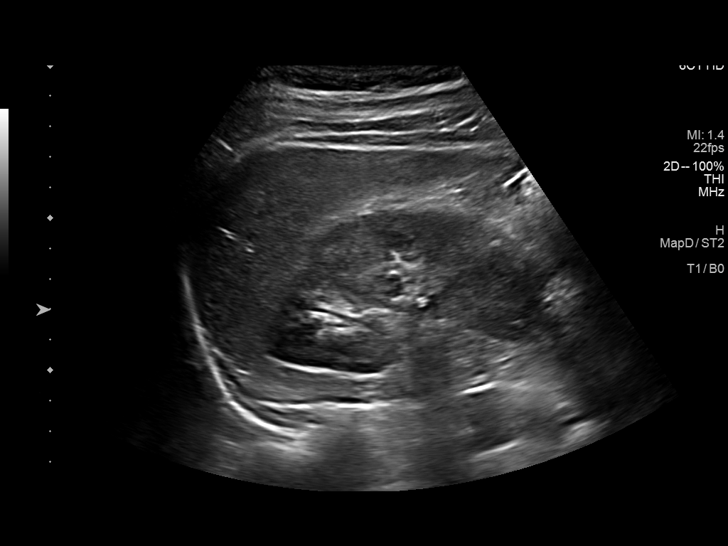
[im 14/42]
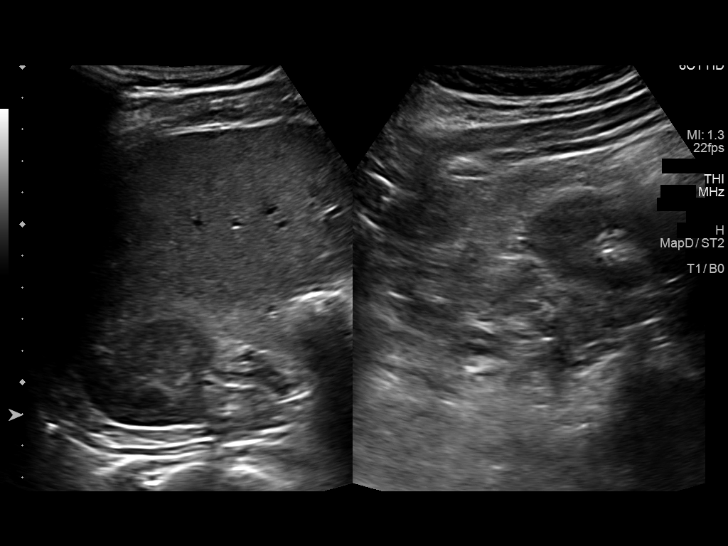
[im 16/42]
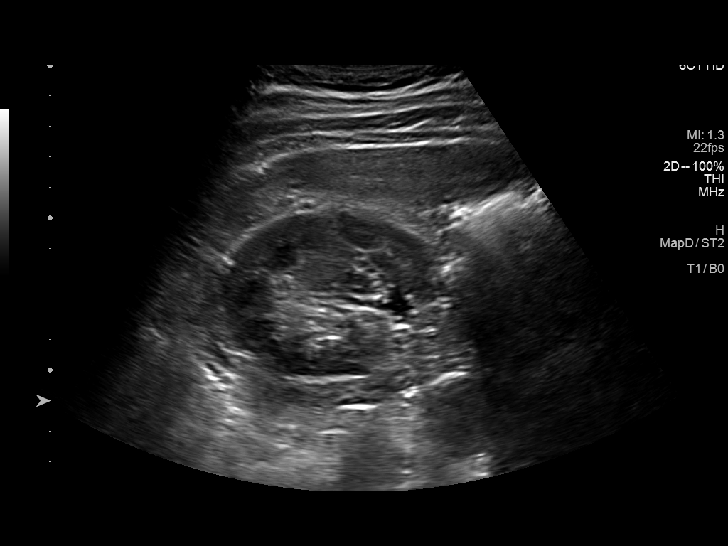
[im 19/42]
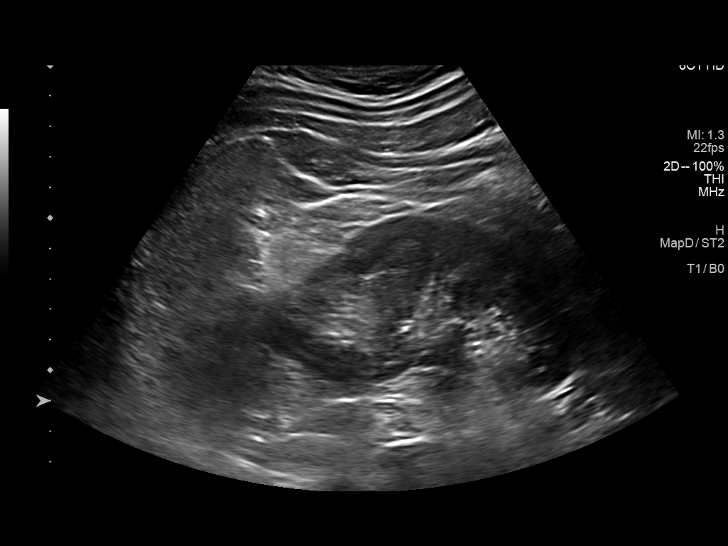
[im 23/42]
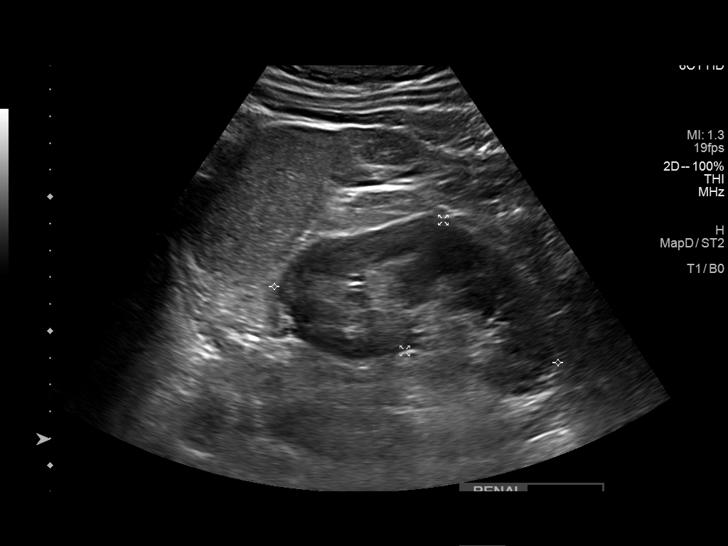
[im 26/42]
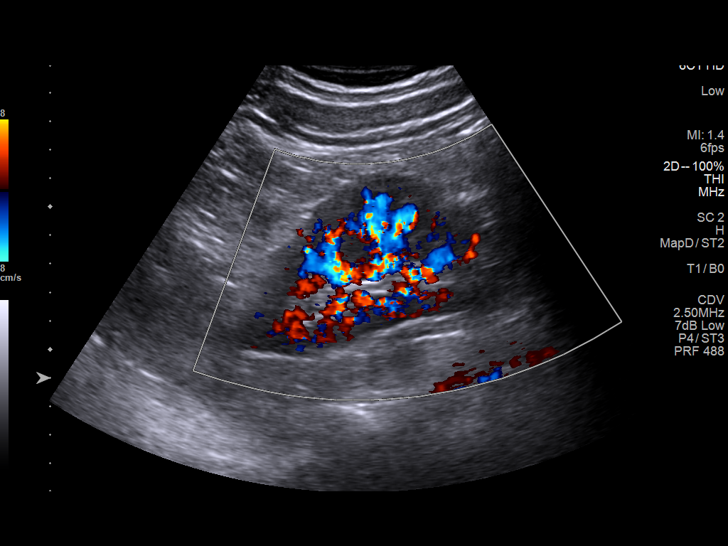
[im 28/42]
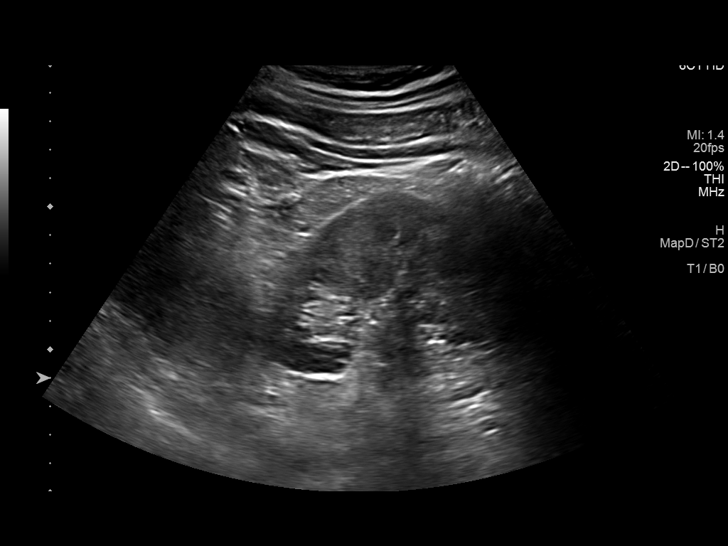
[im 31/42]
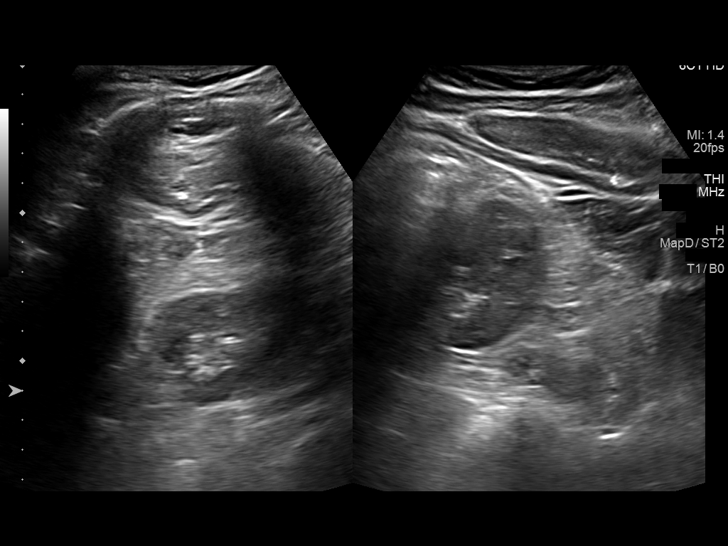
[im 35/42]
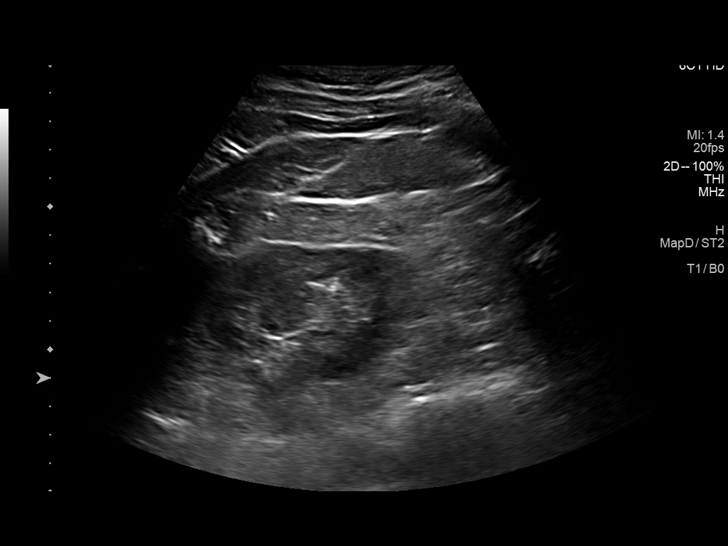
[im 38/42]
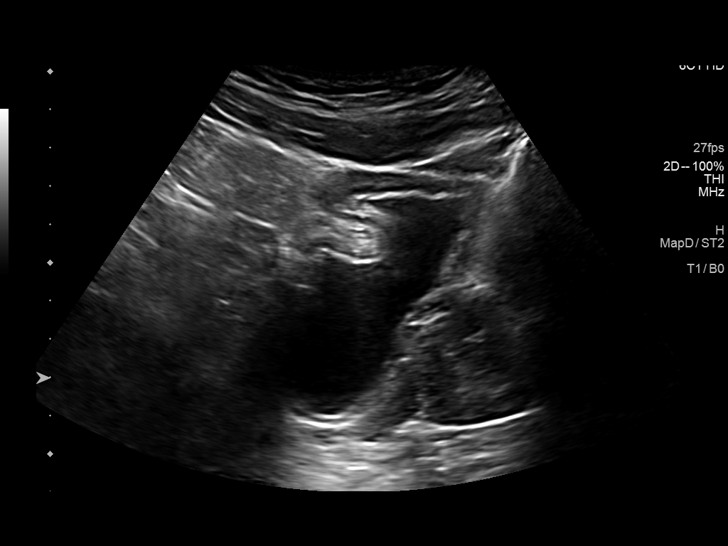
[im 42/42]
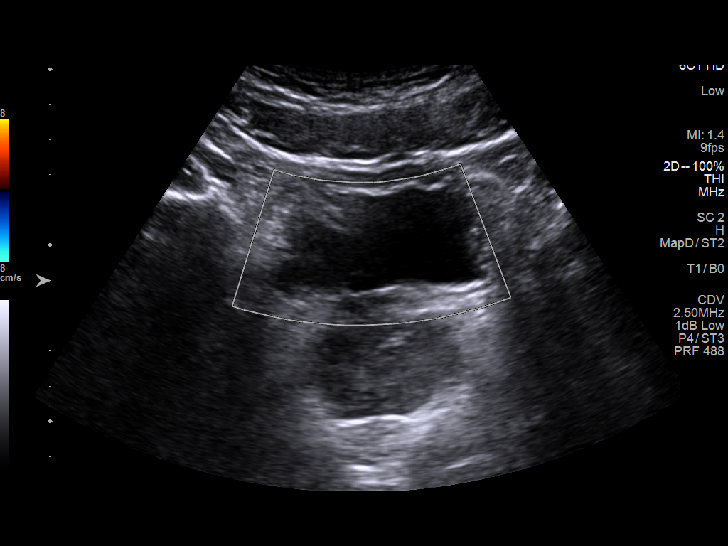

[14 of 25 positions shown; findings below may reference images not displayed]

FINDINGS: Right Kidney:

Renal measurements: 11 x 4.7 x 5.1 cm = volume: 138 mL. Echogenicity
within normal limits. No mass or hydronephrosis visualized.

Left Kidney:

Renal measurements: 11 x 5.1 x 5.0 cm = volume: 146 mL. Echogenicity
within normal limits. No mass or hydronephrosis visualized.

Bladder:

Appears normal for degree of bladder distention.

Other:

None.
IMPRESSION: 1. No acute abnormality.  No hydronephrosis.

## 2022-02-08 ENCOUNTER — Encounter (HOSPITAL_COMMUNITY): Payer: Medicare HMO | Admitting: Student

## 2022-03-06 DIAGNOSIS — E1165 Type 2 diabetes mellitus with hyperglycemia: Secondary | ICD-10-CM | POA: Insufficient documentation

## 2022-03-08 ENCOUNTER — Ambulatory Visit (INDEPENDENT_AMBULATORY_CARE_PROVIDER_SITE_OTHER): Payer: 59 | Admitting: Student

## 2022-03-08 VITALS — BP 112/80 | HR 80

## 2022-03-08 DIAGNOSIS — F411 Generalized anxiety disorder: Secondary | ICD-10-CM

## 2022-03-08 DIAGNOSIS — F331 Major depressive disorder, recurrent, moderate: Secondary | ICD-10-CM | POA: Diagnosis not present

## 2022-03-08 DIAGNOSIS — Z636 Dependent relative needing care at home: Secondary | ICD-10-CM

## 2022-03-08 MED ORDER — FLUOXETINE HCL 20 MG PO CAPS: 60.0000 mg | ORAL_CAPSULE | Freq: Every day | ORAL | 3 refills | Status: DC

## 2022-03-08 NOTE — Progress Notes (Signed)
[place] MD Outpatient Progress Note  03/08/2022 1:45 PM Lance Chapman  MRN:  [id number]  Assessment:  Lance Chapman presents for follow-up evaluation in-person. Today, 03/08/22, patient reports situation has improved regarding support for patient's mom by giving more caregiver relief. Also is working to improve own health including better blood sugar control. Plan to continue fluoxetine at current dose as his GAD and MDD symptoms are improving given his familial situation has been improving.  Identifying Information: Lance Chapman is a 57 y.o. y.o. male with a history of GAD, MDD who is an established patient with [facility] for medication management.   Plan:  # Generalized Anxiety Disorder Past medication trials: improving Status of problem: Improving Interventions: -- Continue prozac 60 mg mg for depression and anxiety -- Continue trazodone 50 mg -- Continue psychotherapy   # Major depressive Disorder-moderate Past medication trials:  Status of problem: improving Interventions: -- Continue prozac and trazodone as above -- Continue psychotherapy   #Caregiver Burden -Improving with familial and nursing support  Patient was given contact information for behavioral health clinic and was instructed to call 911 for emergencies.   Subjective:  Chief Complaint:  Chief Complaint  Patient presents with   Depression   Anxiety    Interval History:  Patient reports that he is overall doing better than he had been when I last saw him in October 2023.  He has been able to receive much-needed support and taking care of of his mother by having a nurse attend to her from 8 AM to 4 PM Monday through Thursday as well as a sitter from 10 AM to 6 PM on Friday.  He continues to take care of mother at nighttime as well as on weekends.  He plans to travel in summer to [place] as well as [place] to see family as well as give himself a break from taking care of mom.  He  continues to have some anxiety regarding family providing the necessary care to mom while he is away but he feels that he will do his best to communicate this and allow himself the time he needs to take care of himself.  He is also working to improve his own health by starting to go out to exercise, taking medications to aid with current diabetes, and getting more support as possible while he is away from mom.  He does report that he currently still is having difficulty with having an intimate companion but is working towards that and understands that this is a process. He denies present SI/HI/AVH. He is agreeable to continuing fluoxetine at present dose. States he is working to eat less sugar-based food and is sleeping better. He plans to follow up with me in 3 months.    Visit Diagnosis:    ICD-10-CM   1. Generalized anxiety disorder  F41.1     2. Moderate recurrent major depression ([name])  F33.1     3. Caregiver burden  Z63.6       Past Psychiatric History: Depression, anxiety, SI, PTSD  Past Medical History:  Past Medical History:  Diagnosis Date   Anxiety    Back pain, chronic    Depression    Hypertension    UTI (lower urinary tract infection)    No past surgical history on file.  Family Psychiatric History: mom depression  Social History:  Social History   Socioeconomic History   Marital status: Single    Spouse name: Not on file  Number of children: Not on file   Years of education: Not on file   Highest education level: Not on file  Occupational History   Not on file  Tobacco Use   Smoking status: Never   Smokeless tobacco: Never  Substance and Sexual Activity   Alcohol use: No   Drug use: No   Sexual activity: Not on file  Other Topics Concern   Not on file  Social History Narrative   Not on file   Social Determinants of Health   Financial Resource Strain: Not on file  Food Insecurity: Not on file  Transportation Needs: Not on file  Physical Activity:  Not on file  Stress: Not on file  Social Connections: Not on file    Allergies:  Allergies  Allergen Reactions   Bee Venom Anaphylaxis   Benadryl Allergy [Diphenhydramine] Anaphylaxis    Current Medications: Current Outpatient Medications  Medication Sig Dispense Refill   acetaminophen (TYLENOL) 500 MG tablet Take 1,000 mg by mouth every 6 (six) hours as needed for headache.     atorvastatin (LIPITOR) 20 MG tablet Take 20 mg by mouth daily.     EPINEPHrine 0.3 mg/0.3 mL IJ SOAJ injection Inject 0.3 mg into the muscle as needed for anaphylaxis.     FLUoxetine (PROZAC) 20 MG capsule Take 3 capsules (60 mg total) by mouth daily. 30 capsule 3   JARDIANCE 10 MG TABS tablet Take 10 mg by mouth daily.     lisinopril-hydrochlorothiazide (ZESTORETIC) 20-12.5 MG tablet Take 1 tablet by mouth daily.     OZEMPIC, 0.25 OR 0.5 MG/DOSE, 2 MG/1.5ML SOPN Inject 0.3 mg into the skin every Friday.     tiZANidine (ZANAFLEX) 2 MG tablet Take 2 mg by mouth 3 (three) times daily as needed for muscle spasms.     traZODone (DESYREL) 50 MG tablet Take 1 tablet (50 mg total) by mouth at bedtime as needed for sleep. 30 tablet 3   TRESIBA FLEXTOUCH 100 UNIT/ML FlexTouch Pen Inject 20 Units into the skin daily.     valACYclovir (VALTREX) 1000 MG tablet Take 1 tablet (1,000 mg total) by mouth 3 (three) times daily. 21 tablet 0   No current facility-administered medications for this visit.    ROS: Review of Systems  Constitutional:  Negative for activity change and appetite change.  Neurological:  Negative for dizziness, light-headedness and headaches.  Psychiatric/Behavioral:  Negative for agitation, behavioral problems, confusion, decreased concentration, dysphoric mood, hallucinations, self-injury, sleep disturbance and suicidal ideas. The patient is not nervous/anxious and is not hyperactive.     Objective:  Psychiatric Specialty Exam: Blood pressure 112/80, pulse 80.There is no height or weight on file  to calculate BMI.  General Appearance: Fairly Groomed  Eye Contact:  Good  Speech:  Clear and Coherent and Normal Rate  Volume:  Normal  Mood:  Euthymic  Affect:  Appropriate and Congruent  Thought Process:  Coherent, Goal Directed, and Linear  Orientation:  Full (Time, Place, and Person)  Thought Content: Logical   Suicidal Thoughts:  No  Homicidal Thoughts:  No  Memory:  Remote;   Good  Judgment:  Good  Insight:  Good  Psychomotor Activity:  Normal  Concentration:  Concentration: Good and Attention Span: Good              Assets:  Communication Skills Desire for Improvement Financial Resources/Insurance Housing Leisure Time Physical Health Resilience Social Support Talents/Skills Transportation  ADL's:  Intact  Cognition: WNL  Sleep:  Fair  PE: General: well-appearing; no acute distress  Pulm: no increased work of breathing on room air  Strength & Muscle Tone: wnl Neuro: no focal neurological deficits observed  Gait & Station: normal  Metabolic Disorder Labs: Lab Results  Component Value Date   HGBA1C 8.0 (H) 11/17/2020   MPG 182.9 11/17/2020   MPG 159.94 05/13/2020   Lab Results  Component Value Date   PROLACTIN 8.6 11/17/2020   PROLACTIN QUANTITY NOT SUFFICIENT, UNABLE TO PERFORM TEST 05/13/2020   Lab Results  Component Value Date   CHOL 236 (H) 11/17/2020   TRIG 116 11/17/2020   HDL 60 11/17/2020   CHOLHDL 3.9 11/17/2020   VLDL 23 11/17/2020   LDLCALC 153 (H) 11/17/2020   LDLCALC 92 05/13/2020   Lab Results  Component Value Date   TSH 0.764 11/17/2020   TSH 1.528 05/13/2020    Therapeutic Level Labs: No results found for: "LITHIUM" No results found for: "VALPROATE" No results found for: "CBMZ"  Screenings: GAD-7    Flowsheet Row Clinical Support from 07/19/2021 in [facility] Counselor from 07/10/2021 in [place] from 11/17/2020 in [facility] Counselor from 09/12/2020 in [place] from 08/16/2020 in [facility]  Total GAD-7 Score '9 11 17 16 16      '$ PHQ2-9    Flowsheet Row Clinical Support from 10/19/2021 in [place] from 07/19/2021 in [facility] Counselor from 07/10/2021 in [place] from 11/17/2020 in [facility] Counselor from 09/12/2020 in [place]  PHQ-2 Total Score '5 5 2 6 4  '$ PHQ-9 Total Score '15 11 11 21 19      '$ Flowsheet Row ED from 08/13/2021 in [facility] Emergency Department at [facility] ED from 04/25/2021 in [facility] Emergency Department at [facility] ED from 11/17/2020 in [place] No Risk No Risk Error: Q3, 4, or 5 should not be populated when Q2 is No       Collaboration of Care: Collaboration of Care:   Patient/Guardian was advised Release of Information must be obtained prior to any record release in order to collaborate their care with an outside provider. Patient/Guardian was advised if they have not already done so to contact the registration department to sign all necessary forms in order for [place] to release information regarding their care.   Consent: Patient/Guardian gives verbal consent for treatment and assignment of benefits for services provided during this visit. Patient/Guardian expressed understanding and agreed to proceed.   A total of 35 minutes was spent involved in face to face clinical care, chart review, and documentation.   France Ravens, MD 03/08/2022, 1:45 PM

## 2022-05-31 ENCOUNTER — Ambulatory Visit (INDEPENDENT_AMBULATORY_CARE_PROVIDER_SITE_OTHER): Payer: 59 | Admitting: Student

## 2022-05-31 ENCOUNTER — Encounter (HOSPITAL_COMMUNITY): Payer: Self-pay | Admitting: Student

## 2022-05-31 VITALS — BP 118/87 | HR 94 | Wt 183.4 lb

## 2022-05-31 DIAGNOSIS — Z636 Dependent relative needing care at home: Secondary | ICD-10-CM

## 2022-05-31 DIAGNOSIS — F411 Generalized anxiety disorder: Secondary | ICD-10-CM

## 2022-05-31 DIAGNOSIS — F331 Major depressive disorder, recurrent, moderate: Secondary | ICD-10-CM

## 2022-05-31 DIAGNOSIS — R45851 Suicidal ideations: Secondary | ICD-10-CM

## 2022-05-31 NOTE — Assessment & Plan Note (Signed)
Status of Problem: improving Interventions: --Continue fluoxetine 60 mg for depression and anxiety --Continue trazodone 50 mg qhs prn for insomnia --Continue psychotherapy

## 2022-05-31 NOTE — Assessment & Plan Note (Signed)
-  Continue fluoxetine and trazodone as above -Continue psychotherapy

## 2022-05-31 NOTE — Assessment & Plan Note (Signed)
-  Improving with familial and home nursing support

## 2022-05-31 NOTE — Progress Notes (Addendum)
[facility] MD Outpatient Progress Note  05/31/2022 3:10 PM [name]  MRN:  [id number]  Assessment:  Lance Chapman presents for follow-up evaluation in-person. Today, 05/31/22, patient reports continued control of his depression and anxiety. He is tolerating his current psychotropic regiment well.  His primary stressor of caring for his mother that has dementia has been greatly alleviated with the help of home nursing as well as family support.  No medication adjustments were made during this visit and we discussed following up in approximately 8 weeks.  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 His A1c has dramatically improved and he reports better control of his diet in the past few months. Encouraged him to continue with his dietary/lifestyle modifications as they appear to also be addressing some of his mental health symptoms as well.   Identifying Information: Lance Chapman is a 57 y.o. y.o. male with a history of GAD, MDD who is an established patient with [facility] for medication management.   Plan: # Generalized Anxiety Disorder Status of problem: Improving Interventions: -- Continue prozac 60 mg mg for depression and anxiety -- Continue trazodone 50 mg -- Continue psychotherapy   # Major depressive Disorder-moderate Status of problem: improving Interventions: -- Continue prozac and trazodone as above -- Continue psychotherapy   #Caregiver Burden -Improving with familial and nursing support  Patient was given contact information for behavioral health clinic and was instructed to call 911 for emergencies.   Subjective:  Chief Complaint:  Chief Complaint  Patient presents with   Anxiety   Depression    Interval History:  Patient reports that he has been doing overall well but has noticed that his mother's dementia has  somewhat progressed in the past few weeks.  He continues to plan for his trip to [place] on 06/12/2022.  His mother will be cared for by patient's other siblings while he is at [place].  He is looking forward to this trip and feels that this will provide an important break for him so that he can maintain his own wellness.  He denies feelings of SI/HI/AVH.  He reports his brother has many things plan for him to do while at [place].  He reports that all of his medications are currently available to him so he will not have any problems with running out of medication during his vacation.  He reports that he is sleeping approximately 8 hours/day unless his mother wakes up randomly in the middle of the night. He reports his appetite has been appropriate and he has been more selective about his intake of sugars which has been evident by his decline in hemoglobin A1c.  He overall reports that he feels that he is physically and mentally better than he has been in months.  He does endorse some symptoms of orthostatic hypotension.  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  He reports that he continues to stay hydrated and takes his time when he stands up in order to prevent himself from passing out.  He has no acute somatic complaints from his Prozac or trazodone and does not feel the need to increase the dosage at this time.  All questions were addressed and patient is to follow-up in approximately 8 weeks.   Visit Diagnosis:    ICD-10-CM   1. Generalized anxiety disorder  F41.1     2. Moderate recurrent major depression (HCC)  F33.1     3. Caregiver burden  Z63.6     4. Suicide ideation  R45.851       Past Medical History:  Past Medical History:  Diagnosis Date   Anxiety    Back pain, chronic    Depression    Hypertension    UTI (lower urinary tract infection)    No past surgical history on file.  Family History:  Family History   Problem Relation Age of Onset   Depression Mother    Dementia Mother     Social History:  Social History   Socioeconomic History   Marital status: Single    Spouse name: Not on file   Number of children: Not on file   Years of education: Not on file   Highest education level: Not on file  Occupational History   Not on file  Tobacco Use   Smoking status: Never   Smokeless tobacco: Never  Substance and Sexual Activity   Alcohol use: No   Drug use: No   Sexual activity: Not on file  Other Topics Concern   Not on file  Social History Narrative   Not on file   Social Determinants of Health   Financial Resource Strain: Not on file  Food Insecurity: Not on file  Transportation Needs: Not on file  Physical Activity: Not on file  Stress: Not on file  Social Connections: Not on file    Allergies:  Allergies  Allergen Reactions   Bee Venom Anaphylaxis   Benadryl Allergy [Diphenhydramine] Anaphylaxis    ROS: All other ROS negative besides those noted in interval history  Objective: Psychiatric Specialty Exam: Blood pressure 118/87, pulse 94, weight 183 lb 6.4 oz (83.2 kg), SpO2 100 %.Body mass index is 27.89 kg/m. General Appearance: Well Groomed  Eye Contact:  Good  Speech:  Clear and Coherent and Normal Rate  Volume:  Normal  Mood:  Euthymic  Affect:  Appropriate and Congruent  Thought Process:  Coherent, Goal Directed, and Linear  Orientation:  Full (Time, Place, and Person)  Thought Content: Logical   Suicidal Thoughts:  No  Homicidal Thoughts:  No  Memory:  Remote;   Good  Judgment:  Fair  Insight:  Fair  Psychomotor Activity:  Normal  Concentration:  Concentration: Good and Attention Span: Good Assets:  Communication Skills Desire for Improvement Financial Resources/Insurance Housing Leisure Time Physical Health Resilience Social Support Talents/Skills Transportation  ADL's:  Intact  Cognition: WNL  Sleep:  Good      PE: General:  well-appearing; no acute distress  Pulm: no increased work of breathing on room air  Strength & Muscle Tone: within normal limits Neuro: no focal neurological deficits observed  Gait & Station: normal  Metabolic Disorder Labs: Lab Results  Component Value Date   HGBA1C 8.0 (H) 11/17/2020   MPG 182.9 11/17/2020   MPG 159.94 05/13/2020   Lab Results  Component Value Date   PROLACTIN 8.6 11/17/2020   PROLACTIN QUANTITY NOT SUFFICIENT, UNABLE TO PERFORM TEST 05/13/2020   Lab Results  Component Value Date   CHOL 236 (H) 11/17/2020   TRIG 116 11/17/2020   HDL 60 11/17/2020   CHOLHDL 3.9 11/17/2020   VLDL 23 11/17/2020   LDLCALC 153 (H) 11/17/2020   LDLCALC 92 05/13/2020   Lab Results  Component Value Date   TSH 0.764 11/17/2020   TSH 1.528 05/13/2020    Therapeutic Level Labs: No  results found for: "LITHIUM" No results found for: "VALPROATE" No results found for: "CBMZ"  Screenings: GAD-7    Flowsheet Row Clinical Support from 07/19/2021 in [facility] Counselor from 07/10/2021 in [facility] Clinical Support from 11/17/2020 in [facility] Counselor from 09/12/2020 in [facility] Clinical Support from 08/16/2020 in [facility]  Total GAD-7 Score 9 11 17 16 16       PHQ2-9    Flowsheet Row Clinical Support from 10/19/2021 in [facility] Clinical Support from 07/19/2021 in [facility] Counselor from 07/10/2021 in [facility] Clinical Support from 11/17/2020 in [facility] Counselor from 09/12/2020 in [facility]  PHQ-2 Total Score 5 5 2 6 4   PHQ-9 Total Score 15 11 11 21 19       Flowsheet Row ED from 08/13/2021 in [facility] Emergency Department at [facility] ED from 04/25/2021 in [facility] Emergency Department at [facility] ED from 11/17/2020 in [facility]  C-SSRS RISK CATEGORY No Risk No Risk Error: Q3, 4, or 5 should not be populated when Q2 is No       A total of 30 minutes was spent involved in face to face clinical care, chart review, and documentation.   Park Pope, MD 05/31/2022, 3:10 PM   I reviewed the patient's chart and discussed the patient and plan of care with the resident. I agree with the findings and plan as documented in the resident's note and above addendum.   Daine Gip, MD 06/02/22

## 2022-06-02 NOTE — Addendum Note (Signed)
Addended by: Theodoro Kos A on: 06/02/2022 08:40 AM   Modules accepted: Level of Service

## 2022-07-26 ENCOUNTER — Telehealth (HOSPITAL_COMMUNITY): Payer: 59 | Admitting: Student

## 2022-07-26 DIAGNOSIS — F411 Generalized anxiety disorder: Secondary | ICD-10-CM

## 2022-07-26 DIAGNOSIS — F331 Major depressive disorder, recurrent, moderate: Secondary | ICD-10-CM

## 2022-07-26 NOTE — Progress Notes (Signed)
BH MD Outpatient Progress Note  07/28/2022 12:34 PM Lance Chapman  MRN:  [id number]  Assessment:  Lance Chapman presents for follow-up evaluation in-person. Today, 07/28/22, patient reports continued control of Lance depression and anxiety. He is tolerating Lance current psychotropic regiment well.  He currently is struggling with mother with dementia being on home hospice and likely to pass in a few days. He would like grief counseling as well as therapy for how to transition from caregiver to another role. Advised to follow up with home hospice for grief counseling and will be referred for psychotherapy. He is otherwise doing well and will follow up with me in ~2 months.   Identifying Information: Lance Chapman is a 57 y.o. y.o. male with a history of GAD, MDD who is an established patient with [facility] for medication management.   Plan: # Generalized Anxiety Disorder Status of problem: Improving Interventions: -- Continue prozac 60 mg mg for depression and anxiety -- Continue trazodone 50 mg -- Refer for psychotherapy   # Major depressive Disorder-moderate Status of problem: improving Interventions: -- Continue prozac and trazodone as above -- Continue psychotherapy   #Caregiver Burden -Improving with familial and nursing support  Patient was given contact information for behavioral health clinic and was instructed to call 911 for emergencies.   Subjective:  Chief Complaint:  Chief Complaint  Patient presents with   Medication Management    Interval History:  Patient reports not doing well as Lance mom's on hospice for past month. He reports she will pass likely in a few days and there is a lot of family coming. He is anxious about this but also believes he did the best that he was able to do as caregiver for her as he was for [place] several years ago. He states he felt he honored Lance mother and Chapman and has no regrets. Mother is currently 42 years  old. He would like to participate in grief counseling which he will follow up with home hospice services for. He is also looking to re-establish with oupatient therapy regarding transitioning from caregiver to living Lance own life as he has no children or partner of Lance own. He denies SI/HI/AVH. He did state he very much enjoyed Lance trip to [place] which provided a much needed break from being primary caregiver for mother.   Visit Diagnosis:    ICD-10-CM   1. Moderate recurrent major depression (HCC)  F33.1        Past Medical History:  Past Medical History:  Diagnosis Date   Anxiety    Back pain, chronic    Depression    Hypertension    UTI (lower urinary tract infection)    No past surgical history on file.  Family History:  Family History  Problem Relation Age of Onset   Depression Mother    Dementia Mother     Social History:  Social History   Socioeconomic History   Marital status: Single    Spouse name: Not on file   Number of children: Not on file   Years of education: Not on file   Highest education level: Not on file  Occupational History   Not on file  Tobacco Use   Smoking status: Never   Smokeless tobacco: Never  Substance and Sexual Activity   Alcohol use: No   Drug use: No   Sexual activity: Not on file  Other Topics Concern   Not on file  Social History  Narrative   Not on file   Social Determinants of Health   Financial Resource Strain: Medium Risk (02/07/2022)   [facility], [facility]   Overall Financial Resource Strain (CARDIA)    Difficulty of Paying Living Expenses: Somewhat hard  Food Insecurity: No Food Insecurity (02/07/2022)   Received from [facility], [facility]   Hunger Vital Sign    Worried About Running Out of Food in the Last Year: Never true    Ran Out of Food in the Last Year: Never true  Transportation Needs: Unmet Transportation Needs (02/07/2022)   [facility], [facility]   PRAPARE -  Transportation    Lack of Transportation (Medical): Yes    Lack of Transportation (Non-Medical): Yes  Physical Activity: Unknown (02/07/2022)   Received [facility], [facility]   Exercise Vital Sign    Days of Exercise per Week: 0 days    Minutes of Exercise per Session: Not on file  Stress: Stress Concern Present (02/07/2022)   Received from [facility] Health - Occupational Stress Questionnaire    Feeling of Stress : Rather much  Social Connections: Moderately Integrated (02/07/2022)   Received from [facility], [facility]   Social Network    How would you rate your social network (family, work, friends)?: Adequate participation with social networks    Allergies:  Allergies  Allergen Reactions   Bee Venom Anaphylaxis   Benadryl Allergy [Diphenhydramine] Anaphylaxis    ROS: All other ROS negative besides those noted in interval history  Objective: Psychiatric Specialty Exam: There were no vitals taken for this visit.There is no height or weight on file to calculate BMI. General Appearance: Well Groomed  Eye Contact:  Good  Speech:  Clear and Coherent and Normal Rate  Volume:  Normal  Mood:  Euthymic  Affect:  Appropriate and Congruent  Thought Process:  Coherent, Goal Directed, and Linear  Orientation:  Full (Time, Place, and Person)  Thought Content: Logical   Suicidal Thoughts:  No  Homicidal Thoughts:  No  Memory:  Remote;   Good  Judgment:  Fair  Insight:  Fair  Psychomotor Activity:  Normal  Concentration:  Concentration: Good and Attention Span: Good Assets:  Communication Skills Desire for Improvement Financial Resources/Insurance Housing Leisure Time Physical Health Resilience Social Support Talents/Skills Transportation  ADL's:  Intact  Cognition: WNL  Sleep:  Good      PE: General: well-appearing; no acute distress  Pulm: no increased work of breathing on room air  Strength &  Muscle Tone: within normal limits Neuro: no focal neurological deficits observed  Gait & Station: normal  Metabolic Disorder Labs: Lab Results  Component Value Date   HGBA1C 8.0 (H) 11/17/2020   MPG 182.9 11/17/2020   MPG 159.94 05/13/2020   Lab Results  Component Value Date   PROLACTIN 8.6 11/17/2020   PROLACTIN QUANTITY NOT SUFFICIENT, UNABLE TO PERFORM TEST 05/13/2020   Lab Results  Component Value Date   CHOL 236 (H) 11/17/2020   TRIG 116 11/17/2020   HDL 60 11/17/2020   CHOLHDL 3.9 11/17/2020   VLDL 23 11/17/2020   LDLCALC 153 (H) 11/17/2020   LDLCALC 92 05/13/2020   Lab Results  Component Value Date   TSH 0.764 11/17/2020   TSH 1.528 05/13/2020    Therapeutic Level Labs: No results found for: "LITHIUM" No results found for: "VALPROATE" No results found for: "CBMZ"  Screenings: GAD-7  Flowsheet Row Clinical Support from 07/19/2021 in [facility] Counselor from 07/10/2021 in [facility] Clinical Support from 11/17/2020 in [facility] Counselor from 09/12/2020 in [facility] Clinical Support from 08/16/2020 in [facility]  Total GAD-7 Score 9 11 17 16 16       PHQ2-9    Flowsheet Row Clinical Support from 10/19/2021 in [facility] Clinical Support from 07/19/2021 in [facility] Counselor from 07/10/2021 in [facility] Clinical Support from 11/17/2020 in [facility] Counselor from 09/12/2020 in [facility]  PHQ-2 Total Score 5 5 2 6 4   PHQ-9 Total Score 15 11 11 21 19       Flowsheet Row ED from 08/13/2021 in [facility] Emergency Department at [facility] ED from 04/25/2021 in [facility] Emergency Department at [facility] ED from 11/17/2020 in [facility]  C-SSRS RISK CATEGORY No Risk No Risk Error: Q3, 4, or 5 should not be populated when Q2 is No      Televisit via video: I connected with Romie Minus on 07/26/22 at 8:30 AM by a video enabled telemedicine application and verified that I am speaking with the correct person using two identifiers.  Location: Patient: home Provider: office   I discussed the limitations of evaluation and management by telemedicine and the availability of in person appointments. The patient expressed understanding and agreed to proceed.  I discussed the assessment and treatment plan with the patient. The patient was provided an opportunity to ask questions and all were answered. The patient agreed with the plan and demonstrated an understanding of the instructions.   The patient was advised to call back or seek an in-person evaluation if the symptoms worsen or if the condition fails to improve as anticipated.   Park Pope, MD 07/28/2022, 12:34 PM

## 2022-07-28 ENCOUNTER — Encounter (HOSPITAL_COMMUNITY): Payer: Self-pay | Admitting: Student

## 2022-07-28 MED ORDER — FLUOXETINE HCL 20 MG PO CAPS: 60.0000 mg | ORAL_CAPSULE | Freq: Every day | ORAL | 1 refills | Status: DC

## 2022-10-22 NOTE — Progress Notes (Unsigned)
BH MD Outpatient Progress Note  10/23/2022 1:11 PM Lance Chapman  MRN:  [id number]  Assessment:  Lance Chapman presents for follow-up evaluation in-person. On initial visit, patient presented due to GAD, MDD, and caregiver burden that stemmed from aiding with mother who had suffered from Alzheimer's Disease and lack of companionship.  Today, 10/23/22, patient reports worsening of anxiety and depression secondary to passing of mother, transitioning to living by himself, and onset of neuropathy. Discussed ozempic affecting absorption of fluoxetine and increasing fluoxetine to better manage depression. Also restarting hydroxyzine as needed for anxiety. He is amenable to restarting psychotherapy and has monthly grief therapy through hospice care.  Identifying Information: Lance Chapman is a 57 y.o. y.o. male with a history of GAD, MDD who is an established patient with [facility] for medication management.   Plan: # Generalized Anxiety Disorder Status of problem: Improving Interventions: -- Increase fluoxetine to 80 mg mg for depression and anxiety -- Continue trazodone 50 mg at bedtime prn for insomnia -- START hydroxyzine 10 mg tid prn for anxiety -- Refer for psychotherapy   # Major depressive Disorder-moderate Status of problem: improving Interventions: -- Continue prozac and trazodone as above -- Continue psychotherapy   #Caregiver Burden -Improving with familial and nursing support  Patient was given contact information for behavioral health clinic and was instructed to call 911 for emergencies.   Subjective:  Chief Complaint:  Chief Complaint  Patient presents with   Medication Management    Interval History:  Patient reports worsening depressive symptoms secondary to medical problems, mother passing in July, and transitioning to living without mom. He has had increased frequency of crying spell, depressed mood, and anxiety. He denies SI/HI/AVH.  He was amenable to increasing fluoxetine and starting hydroxyzine for PRN anxiety medication. He reports it had been effective in the past to treat anxiety so was open to restarting it. He was open to more long-term therapy to aid with his transitioning his life. He plans to see PCP, podiatrist, and neurologist for neuropathy.   Visit Diagnosis:    ICD-10-CM   1. Moderate recurrent major depression (HCC)  F33.1 traZODone (DESYREL) 50 MG tablet    FLUoxetine (PROZAC) 40 MG capsule    hydrOXYzine (ATARAX) 10 MG tablet    2. Grief  F43.21         Past Medical History:  Past Medical History:  Diagnosis Date   Anxiety    Back pain, chronic    Depression    Hypertension    UTI (lower urinary tract infection)    No past surgical history on file.  Family History:  Family History  Problem Relation Age of Onset   Depression Mother    Dementia Mother     Social History:  Social History   Socioeconomic History   Marital status: Single    Spouse name: Not on file   Number of children: Not on file   Years of education: Not on file   Highest education level: Not on file  Occupational History   Not on file  Tobacco Use   Smoking status: Never   Smokeless tobacco: Never  Substance and Sexual Activity   Alcohol use: No   Drug use: No   Sexual activity: Not on file  Other Topics Concern   Not on file  Social History Narrative   Not on file   Social Determinants of Health   Financial Resource Strain: Medium Risk (02/07/2022)   [facility]   Overall Financial Resource Strain (CARDIA)    Difficulty of Paying Living Expenses: Somewhat hard  Food Insecurity: No Food Insecurity (02/07/2022)   [facility], [facility]   Hunger Vital Sign    Worried About Running Out of Food in the Last Year: Never true    Ran Out of Food in the Last Year: Never true  Transportation Needs: Unmet Transportation Needs (02/07/2022)   Received [facility], [facility]   PRAPARE - Transportation    Lack of Transportation (Medical): Yes    Lack of Transportation (Non-Medical): Yes  Physical Activity: Unknown (02/07/2022)   Received from [facility], [facility]   Exercise Vital Sign    Days of Exercise per Week: 0 days    Minutes of Exercise per Session: Not on file  Stress: Stress Concern Present (02/07/2022)   Received from [facility], [facility] of Occupational Health - Occupational Stress Questionnaire    Feeling of Stress : Rather much  Social Connections: Moderately Integrated (02/07/2022)   [facility] from [facility]   Social Network    How would you rate your social network (family, work, friends)?: Adequate participation with social networks    Allergies:  Allergies  Allergen Reactions   Bee Venom Anaphylaxis   Benadryl Allergy [Diphenhydramine] Anaphylaxis    ROS: All other ROS negative besides those noted in interval history  Objective: Psychiatric Specialty Exam: There were no vitals taken for this visit.There is no height or weight on file to calculate BMI. General Appearance: Well Groomed  Eye Contact:  Good  Speech:  Clear and Coherent and Normal Rate  Volume:  Normal  Mood:  Euthymic  Affect:  Appropriate and Congruent  Thought Process:  Coherent, Goal Directed, and Linear  Orientation:  Full (Time, Place, and Person)  Thought Content: Logical   Suicidal Thoughts:  No  Homicidal Thoughts:  No  Memory:  Remote;   Good  Judgment:  Fair  Insight:  Fair  Psychomotor Activity:  Normal  Concentration:  Concentration: Good and Attention Span: Good Assets:  Communication Skills Desire for Improvement Financial Resources/Insurance Housing Leisure Time Physical Health Resilience Social Support Talents/Skills Transportation  ADL's:  Intact  Cognition: WNL  Sleep:  Good      PE: General: well-appearing; no acute distress  Pulm: no increased work of  breathing on room air  Strength & Muscle Tone: within normal limits Neuro: no focal neurological deficits observed  Gait & Station: normal  Metabolic Disorder Labs: Lab Results  Component Value Date   HGBA1C 8.0 (H) 11/17/2020   MPG 182.9 11/17/2020   MPG 159.94 05/13/2020   Lab Results  Component Value Date   PROLACTIN 8.6 11/17/2020   PROLACTIN QUANTITY NOT SUFFICIENT, UNABLE TO PERFORM TEST 05/13/2020   Lab Results  Component Value Date   CHOL 236 (H) 11/17/2020   TRIG 116 11/17/2020   HDL 60 11/17/2020   CHOLHDL 3.9 11/17/2020   VLDL 23 11/17/2020   LDLCALC 153 (H) 11/17/2020   LDLCALC 92 05/13/2020   Lab Results  Component Value Date   TSH 0.764 11/17/2020   TSH 1.528 05/13/2020    Therapeutic Level Labs: No results found for: "LITHIUM" No results found for: "VALPROATE" No results found for: "CBMZ"  Screenings: GAD-7    Flowsheet Row Clinical Support from 07/19/2021 in [facility] Counselor from 07/10/2021 in [facility] Clinical Support from  11/17/2020 in [facility] Counselor from 09/12/2020 in [facility] Clinical Support from 08/16/2020 in [facility]  Total GAD-7 Score 9 11 17 16 16       PHQ2-9    Flowsheet Row Clinical Support from 10/19/2021 in [facility] Clinical Support from 07/19/2021 in [facility] Counselor from 07/10/2021 in [facility] Clinical Support from 11/17/2020 in [facility] Counselor from 09/12/2020 in [facility]  PHQ-2 Total Score 5 5 2 6 4   PHQ-9 Total Score 15 11 11 21 19       Flowsheet Row ED from 08/13/2021 in [facility] Emergency Department at [facility] ED from 04/25/2021 in [facility] Emergency Department at [facility] ED from  11/17/2020 in [facility]  C-SSRS RISK CATEGORY No Risk No Risk Error: Q3, 4, or 5 should not be populated when Q2 is No         Park Pope, MD 10/23/2022, 1:11 PM

## 2022-10-23 ENCOUNTER — Ambulatory Visit (INDEPENDENT_AMBULATORY_CARE_PROVIDER_SITE_OTHER): Payer: 59 | Admitting: Student

## 2022-10-23 DIAGNOSIS — F4321 Adjustment disorder with depressed mood: Secondary | ICD-10-CM

## 2022-10-23 DIAGNOSIS — F331 Major depressive disorder, recurrent, moderate: Secondary | ICD-10-CM

## 2022-10-23 MED ORDER — HYDROXYZINE HCL 10 MG PO TABS: 10.0000 mg | ORAL_TABLET | Freq: Three times a day (TID) | ORAL | 2 refills | Status: DC | PRN

## 2022-10-23 MED ORDER — TRAZODONE HCL 50 MG PO TABS: 50.0000 mg | ORAL_TABLET | Freq: Every evening | ORAL | 2 refills | Status: DC | PRN

## 2022-10-23 MED ORDER — FLUOXETINE HCL 40 MG PO CAPS: 80.0000 mg | ORAL_CAPSULE | Freq: Every day | ORAL | 2 refills | Status: DC

## 2022-10-24 NOTE — Addendum Note (Signed)
Addended by: Everlena Cooper on: 10/24/2022 09:44 AM   Modules accepted: Level of Service

## 2022-12-14 ENCOUNTER — Encounter (HOSPITAL_COMMUNITY): Payer: Medicare HMO | Admitting: Student

## 2022-12-18 ENCOUNTER — Ambulatory Visit (HOSPITAL_COMMUNITY): Payer: Medicare HMO | Admitting: Mental Health

## 2023-01-08 ENCOUNTER — Encounter (HOSPITAL_COMMUNITY): Payer: Medicare HMO | Admitting: Student

## 2023-02-05 NOTE — Progress Notes (Deleted)
 BH MD Outpatient Progress Note  02/05/2023 4:33 PM Lance Chapman  MRN:  [id number]  Assessment:  Lance Chapman presents for follow-up evaluation in-person. On initial visit, patient presented due to GAD, MDD, and caregiver burden that stemmed from aiding with mother who had suffered from Alzheimer's Disease and lack of companionship.  Today, 02/05/23, patient reports worsening of anxiety and depression secondary to passing of mother, transitioning to living by himself, and onset of neuropathy. Discussed ozempic affecting absorption of fluoxetine  and increasing fluoxetine  to better manage depression. Also restarting hydroxyzine  as needed for anxiety. He is amenable to restarting psychotherapy and has monthly grief therapy through hospice care.  Identifying Information: Lance Chapman is a 58 y.o. y.o. male with a history of GAD, MDD who is an established patient with [facility] for medication management.   Plan: # Generalized Anxiety Disorder Status of problem: Improving Interventions: -- Increase fluoxetine  to 80 mg mg for depression and anxiety -- Continue trazodone  50 mg at bedtime prn for insomnia -- START hydroxyzine  10 mg tid prn for anxiety -- Refer for psychotherapy   # Major depressive Disorder-moderate Status of problem: improving Interventions: -- Continue prozac  and trazodone  as above -- Continue psychotherapy   #Caregiver Burden -Improving with familial and nursing support  Patient was given contact information for behavioral health clinic and was instructed to call 911 for emergencies.   Subjective:  Chief Complaint:  No chief complaint on file.   Interval History:  Patient reports worsening depressive symptoms secondary to medical problems, mother passing in July, and transitioning to living without mom. He has had increased frequency of crying spell, depressed mood, and anxiety. He denies SI/HI/AVH. He was amenable to increasing fluoxetine   and starting hydroxyzine  for PRN anxiety medication. He reports it had been effective in the past to treat anxiety so was open to restarting it. He was open to more long-term therapy to aid with his transitioning his life. He plans to see PCP, podiatrist, and neurologist for neuropathy.   Visit Diagnosis:  No diagnosis found.     Past Medical History:  Past Medical History:  Diagnosis Date   Anxiety    Back pain, chronic    Depression    Hypertension    UTI (lower urinary tract infection)    No past surgical history on file.  Family History:  Family History  Problem Relation Age of Onset   Depression Mother    Dementia Mother     Social History:  Social History   Socioeconomic History   Marital status: Single    Spouse name: Not on file   Number of children: Not on file   Years of education: Not on file   Highest education level: Not on file  Occupational History   Not on file  Tobacco Use   Smoking status: Never   Smokeless tobacco: Never  Substance and Sexual Activity   Alcohol use: No   Drug use: No   Sexual activity: Not on file  Other Topics Concern   Not on file  Social History Narrative   Not on file   Social Drivers of Health   Financial Resource Strain: Medium Risk (02/07/2022)   Received from [facility], [facility]   Overall Financial Resource Strain (CARDIA)    Difficulty of Paying Living Expenses: Somewhat hard  Food Insecurity: No Food Insecurity (02/07/2022)   Received from [facility], [facility]   Hunger Vital Sign    Worried About Running Out  of Food in the Last Year: Never true    Ran Out of Food in the Last Year: Never true  Transportation Needs: Unmet Transportation Needs (02/07/2022)   Received from [facility], [facility]   [facility] - Transportation    Lack of Transportation (Medical): Yes    Lack of Transportation (Non-Medical): Yes  Physical Activity: Unknown (02/07/2022)   Received from [facility], [facility]    Exercise Vital Sign    Days of Exercise per Week: 0 days    Minutes of Exercise per Session: Not on file  Stress: Stress Concern Present (02/07/2022)   Received from [facility], [facility] of Occupational Health - Occupational Stress Questionnaire    Feeling of Stress : Rather much  Social Connections: Moderately Integrated (02/07/2022)   Received from [facility], [facility]   Social Network    How would you rate your social network (family, work, friends)?: Adequate participation with social networks    Allergies:  Allergies  Allergen Reactions   Bee Venom Anaphylaxis   Benadryl Allergy [Diphenhydramine] Anaphylaxis    ROS: All other ROS negative besides those noted in interval history  Objective: Psychiatric Specialty Exam: There were no vitals taken for this visit.There is no height or weight on file to calculate BMI. General Appearance: Well Groomed  Eye Contact:  Good  Speech:  Clear and Coherent and Normal Rate  Volume:  Normal  Mood:  Euthymic  Affect:  Appropriate and Congruent  Thought Process:  Coherent, Goal Directed, and Linear  Orientation:  Full (Time, Place, and Person)  Thought Content: Logical   Suicidal Thoughts:  No  Homicidal Thoughts:  No  Memory:  Remote;   Good  Judgment:  Fair  Insight:  Fair  Psychomotor Activity:  Normal  Concentration:  Concentration: Good and Attention Span: Good Assets:  Communication Skills Desire for Improvement Financial Resources/Insurance Housing Leisure Time Physical Health Resilience Social Support Talents/Skills Transportation  ADL's:  Intact  Cognition: WNL  Sleep:  Good      PE: General: well-appearing; no acute distress  Pulm: no increased work of breathing on room air  Strength & Muscle Tone: within normal limits Neuro: no focal neurological deficits observed  Gait & Station: normal  Metabolic Disorder Labs: Lab Results  Component Value Date   HGBA1C 8.0 (H)  11/17/2020   MPG 182.9 11/17/2020   MPG 159.94 05/13/2020   Lab Results  Component Value Date   PROLACTIN 8.6 11/17/2020   PROLACTIN QUANTITY NOT SUFFICIENT, UNABLE TO PERFORM TEST 05/13/2020   Lab Results  Component Value Date   CHOL 236 (H) 11/17/2020   TRIG 116 11/17/2020   HDL 60 11/17/2020   CHOLHDL 3.9 11/17/2020   VLDL 23 11/17/2020   LDLCALC 153 (H) 11/17/2020   LDLCALC 92 05/13/2020   Lab Results  Component Value Date   TSH 0.764 11/17/2020   TSH 1.528 05/13/2020    Therapeutic Level Labs: No results found for: LITHIUM No results found for: VALPROATE No results found for: CBMZ  Screenings: GAD-7    Flowsheet Row Clinical Support from 07/19/2021 in [facility] Counselor from 07/10/2021 in [facility] Clinical Support from 11/17/2020 in [facility] Counselor from 09/12/2020 in [facility] Clinical Support from 08/16/2020 in [facility]  Total GAD-7 Score 9 11 17 16 16       PHQ2-9    Flowsheet Row Clinical Support  from 10/19/2021 in [facility] Clinical Support from 07/19/2021 in [facility] Counselor from 07/10/2021 in [facility] Clinical Support from 11/17/2020 in [facility] Counselor from 09/12/2020 in [facility]  PHQ-2 Total Score 5 5 2 6 4   PHQ-9 Total Score 15 11 11 21 19       Flowsheet Row ED from 08/13/2021 in [facility] Emergency Department at [facility] ED from 04/25/2021 in [facility] Emergency Department at [facility] ED from 11/17/2020 in [facility]  C-SSRS RISK CATEGORY No Risk No Risk Error: Q3, 4, or 5 should not be populated when Q2 is No         Prentice Espy, MD 02/05/2023, 4:33 PM

## 2023-02-06 ENCOUNTER — Ambulatory Visit (HOSPITAL_COMMUNITY): Payer: 59 | Admitting: Student

## 2023-02-07 ENCOUNTER — Encounter (HOSPITAL_COMMUNITY): Payer: Medicare HMO | Admitting: Student

## 2023-02-11 ENCOUNTER — Ambulatory Visit (INDEPENDENT_AMBULATORY_CARE_PROVIDER_SITE_OTHER): Payer: Medicare HMO | Admitting: Licensed Clinical Social Worker

## 2023-02-11 DIAGNOSIS — Z91199 Patient's noncompliance with other medical treatment and regimen due to unspecified reason: Secondary | ICD-10-CM

## 2023-02-11 NOTE — Progress Notes (Signed)
 THERAPIST PROGRESS NOTE   Session Date: 02/11/2023  Session Time: 0800  Patient no-showed today's appointment; appointment was for intake [facility] to establish care.   Patsi Boots, MSW, LCSW 02/11/2023,  8:19 AM

## 2023-02-12 ENCOUNTER — Ambulatory Visit (HOSPITAL_COMMUNITY): Payer: Medicare HMO | Admitting: Clinical

## 2023-02-15 ENCOUNTER — Telehealth (HOSPITAL_COMMUNITY): Payer: Self-pay

## 2023-02-15 DIAGNOSIS — F331 Major depressive disorder, recurrent, moderate: Secondary | ICD-10-CM

## 2023-02-15 MED ORDER — FLUOXETINE HCL 40 MG PO CAPS: 80.0000 mg | ORAL_CAPSULE | Freq: Every day | ORAL | 0 refills | Status: DC

## 2023-02-15 MED ORDER — HYDROXYZINE HCL 10 MG PO TABS: 10.0000 mg | ORAL_TABLET | Freq: Three times a day (TID) | ORAL | 0 refills | Status: DC | PRN

## 2023-02-15 NOTE — Telephone Encounter (Signed)
Sent to preferred pharmacy

## 2023-02-15 NOTE — Telephone Encounter (Signed)
Pt/ pharmacy is requesting a refill of hydrOXYzine (ATARAX) 10 MG tablet . Next app 2/19

## 2023-02-15 NOTE — Addendum Note (Signed)
Addended by: Park Pope B on: 02/15/2023 11:26 AM   Modules accepted: Orders

## 2023-02-20 ENCOUNTER — Telehealth (HOSPITAL_BASED_OUTPATIENT_CLINIC_OR_DEPARTMENT_OTHER): Payer: 59 | Admitting: Student

## 2023-02-20 DIAGNOSIS — F411 Generalized anxiety disorder: Secondary | ICD-10-CM

## 2023-02-20 DIAGNOSIS — F331 Major depressive disorder, recurrent, moderate: Secondary | ICD-10-CM

## 2023-02-20 NOTE — Progress Notes (Cosign Needed)
BH MD Outpatient Progress Note Telephone Encounter  02/20/2023 8:41 PM Lance Chapman  MRN:  [id number]  Televisit via phone call: I connected with Lance Chapman on 02/20/23 at  3:30 PM EST by a video enabled telemedicine application and verified that I am speaking with the correct person using two identifiers.  Location: Patient: home in [place] Provider: office   I discussed the limitations of evaluation and management by telemedicine and the availability of in person appointments. The patient expressed understanding and agreed to proceed.  I discussed the assessment and treatment plan with the patient. The patient was provided an opportunity to ask questions and all were answered. The patient agreed with the plan and demonstrated an understanding of the instructions.   The patient was advised to call back or seek an in-person evaluation if the symptoms worsen or if the condition fails to improve as anticipated.  Assessment:  Lance Chapman presents for follow-up evaluation in-person. On initial visit, patient presented due to GAD, MDD, and caregiver burden that stemmed from aiding with mother who had suffered from Alzheimer's Disease and lack of companionship.  Today, 02/20/23, patient reports overall feeling that his symptoms of depression and anxiety have fluctuated but are manageable.  He denies any somatic complaints from fluoxetine.  He does not regularly use the trazodone and hydroxyzine but does appreciate having them available as they have been previously effective for management of his insomnia and anxiety respectively.  He endorsed some insomnia related to waking up in the middle of the night to which we discussed sleep hygiene which patient will review and attempt to apply interventions.  There is also the possibility that this is related to him waking up previously in the middle of the night to take care of mom with dementia.  Plan to continue psychotropic as prescribed.   He sees a grief counselor with regards to loss of mom last year as well as is seeking regular psychotherapy in order to discuss other strategies to manage his anxiety.   Identifying Information: Lance Chapman is a 58 y.o. y.o. male with a history of GAD, MDD who is an established patient with [facility] for medication management.   Plan: # Generalized Anxiety Disorder Status of problem: Improving Interventions: -- Continue fluoxetine 80 mg mg for depression and anxiety -- Continue trazodone 50 mg at bedtime prn for insomnia -- Continue hydroxyzine 10 mg tid prn for anxiety -- Refer for psychotherapy   # Major depressive Disorder-moderate Status of problem: improving Interventions: -- Continue prozac and trazodone as above -- Continue psychotherapy   #Grief -Grief counselor meeting with patient regularly  Patient was given contact information for behavioral health clinic and was instructed to call 911 for emergencies.   Subjective:  Chief Complaint:  Medication Management  Interval History:  Patient reports past few months he has been doing well.  He reports that he has his "ups and downs" but states that overall his mood has been manageable.  He reports some anxiety related to his medical health but feels confident that he is working to improve it.  He endorses some symptoms of orthostasis and may be related to his antihypertensive.  He reports that he is also working to manage his neuropathy, diabetes, and attempting to lose weight as well.  He reports being compliant with his fluoxetine and denies any somatic complaints from recent increase.  He reports eating well.  He reports sleeping has been difficult at times due  to waking up in the around 1 to 3 AM and having difficulty falling back asleep.  We discussed sleep hygiene including not being in bed when not fatigued, not watching TV in the middle of the night, and reducing any types of artificial light and  may be on around his house.  He was amenable to attempting these interventions.  He reports significant family support as they regularly check in with him. He would like to continue psychotropics as prescribed given he has found his anxiety and depression are manageable at this time. He is still looking for therapy to discuss his desire for companionship.   Visit Diagnosis:    ICD-10-CM   1. Moderate recurrent major depression (HCC)  F33.1          Past Medical History:  Past Medical History:  Diagnosis Date   Anxiety    Back pain, chronic    Depression    Hypertension    UTI (lower urinary tract infection)    No past surgical history on file.  Family History:  Family History  Problem Relation Age of Onset   Depression Mother    Dementia Mother     Social History:  Social History   Socioeconomic History   Marital status: Single    Spouse name: Not on file   Number of children: Not on file   Years of education: Not on file   Highest education level: Not on file  Occupational History   Not on file  Tobacco Use   Smoking status: Never   Smokeless tobacco: Never  Substance and Sexual Activity   Alcohol use: No   Drug use: No   Sexual activity: Not on file  Other Topics Concern   Not on file  Social History Narrative   Not on file   Social Drivers of Health   Financial Resource Strain: Low Risk  (02/14/2023)   Received from [facility]   Overall Financial Resource Strain (CARDIA)    Difficulty of Paying Living Expenses: Not hard at all  Food Insecurity: No Food Insecurity (02/14/2023)   Received from [facility]   Hunger Vital Sign    Worried About Running Out of Food in the Last Year: Never true    Ran Out of Food in the Last Year: Never true  Transportation Needs: No Transportation Needs (02/14/2023)   Received from [facility] - Transportation    Lack of Transportation (Medical): No    Lack of Transportation (Non-Medical): No  Physical  Activity: Sufficiently Active (02/14/2023)   Received from [facility]   Exercise Vital Sign    Days of Exercise per Week: 5 days    Minutes of Exercise per Session: 60 min  Stress: Stress Concern Present (02/14/2023)   Received from [facility] of Occupational Health - Occupational Stress Questionnaire    Feeling of Stress : To some extent  Social Connections: Socially Integrated (02/14/2023)   Received from [facility]   Social Network    How would you rate your social network (family, work, friends)?: Good participation with social networks    Allergies:  Allergies  Allergen Reactions   Bee Venom Anaphylaxis   Benadryl Allergy [Diphenhydramine] Anaphylaxis    ROS: All other ROS negative besides those noted in interval history  Objective: Psychiatric Specialty Exam: There were no vitals taken for this visit.There is no height or weight on file to calculate BMI. General Appearance: Well Groomed  Eye Contact:  Good  Speech:  Clear and Coherent and Normal Rate  Volume:  Normal  Mood:  Euthymic  Affect:  Appropriate and Congruent  Thought Process:  Coherent, Goal Directed, and Linear  Orientation:  Full (Time, Place, and Person)  Thought Content: Logical   Suicidal Thoughts:  No  Homicidal Thoughts:  No  Memory:  Remote;   Good  Judgment:  Fair  Insight:  Fair  Psychomotor Activity:  Normal  Concentration:  Concentration: Good and Attention Span: Good Assets:  Communication Skills Desire for Improvement Financial Resources/Insurance Housing Leisure Time Physical Health Resilience Social Support Talents/Skills Transportation  ADL's:  Intact  Cognition: WNL  Sleep:  Good      PE: General: well-appearing; no acute distress  Pulm: no increased work of breathing on room air  Strength & Muscle Tone: within normal limits Neuro: no focal neurological deficits observed  Gait & Station: normal  Metabolic Disorder Labs: Lab Results   Component Value Date   HGBA1C 8.0 (H) 11/17/2020   MPG 182.9 11/17/2020   MPG 159.94 05/13/2020   Lab Results  Component Value Date   PROLACTIN 8.6 11/17/2020   PROLACTIN QUANTITY NOT SUFFICIENT, UNABLE TO PERFORM TEST 05/13/2020   Lab Results  Component Value Date   CHOL 236 (H) 11/17/2020   TRIG 116 11/17/2020   HDL 60 11/17/2020   CHOLHDL 3.9 11/17/2020   VLDL 23 11/17/2020   LDLCALC 153 (H) 11/17/2020   LDLCALC 92 05/13/2020   Lab Results  Component Value Date   TSH 0.764 11/17/2020   TSH 1.528 05/13/2020    Therapeutic Level Labs: No results found for: "LITHIUM" No results found for: "VALPROATE" No results found for: "CBMZ"  Screenings: GAD-7    Flowsheet Row Clinical Support from 07/19/2021 in [facility] Counselor from 07/10/2021 in [facility] Clinical Support from 11/17/2020 in [facility] Counselor from 09/12/2020 in [facility] Clinical Support from 08/16/2020 in [facility]  Total GAD-7 Score 9 11 17 16 16       PHQ2-9    Flowsheet Row Clinical Support from 10/19/2021 in [facility] Clinical Support from 07/19/2021 in [facility] Counselor from 07/10/2021 in [facility] Clinical Support from 11/17/2020 in [facility] Counselor from 09/12/2020 in [facility]  PHQ-2 Total Score 5 5 2 6 4   PHQ-9 Total Score 15 11 11 21 19       Flowsheet Row ED from 08/13/2021 in [facility] Emergency Department at [facility] ED from 04/25/2021 in [facility] Emergency Department at [facility] ED from 11/17/2020 in [facility]  C-SSRS RISK CATEGORY No Risk No Risk Error: Q3, 4, or 5 should not be populated when Q2 is No        Park Pope,  MD 02/20/2023, 8:41 PM

## 2023-03-07 ENCOUNTER — Telehealth (HOSPITAL_COMMUNITY): Payer: Self-pay

## 2023-03-07 DIAGNOSIS — F331 Major depressive disorder, recurrent, moderate: Secondary | ICD-10-CM

## 2023-03-07 NOTE — Telephone Encounter (Signed)
 Hello,      Pt/ Pharmacy is requesting for a refill of traZODone (DESYREL) 50 MG tablet to be sent in.

## 2023-03-08 MED ORDER — TRAZODONE HCL 50 MG PO TABS: 50.0000 mg | ORAL_TABLET | Freq: Every evening | ORAL | 0 refills | Status: DC | PRN

## 2023-03-08 NOTE — Telephone Encounter (Signed)
 Refill sent to preferred pharmacy.

## 2023-03-08 NOTE — Addendum Note (Signed)
 Addended by: Park Pope B on: 03/08/2023 04:27 PM   Modules accepted: Orders

## 2023-03-15 ENCOUNTER — Telehealth (HOSPITAL_COMMUNITY): Payer: Self-pay

## 2023-03-15 DIAGNOSIS — F331 Major depressive disorder, recurrent, moderate: Secondary | ICD-10-CM

## 2023-03-15 MED ORDER — HYDROXYZINE HCL 10 MG PO TABS: 10.0000 mg | ORAL_TABLET | Freq: Three times a day (TID) | ORAL | 0 refills | Status: DC | PRN

## 2023-03-15 NOTE — Telephone Encounter (Signed)
 Hello,   Pt/ pharmacy is requesting a refill of hydrOXYzine (ATARAX) 10 MG tablet . Last app 2/19       Pt is also requesting for PROzac to be sent in as well.

## 2023-03-15 NOTE — Telephone Encounter (Signed)
 Refill sent to preferred pharmacy.

## 2023-03-15 NOTE — Addendum Note (Signed)
 Addended by: Park Pope B on: 03/15/2023 11:55 AM   Modules accepted: Orders

## 2023-03-18 ENCOUNTER — Ambulatory Visit (HOSPITAL_COMMUNITY): Payer: 59 | Admitting: Licensed Clinical Social Worker

## 2023-03-18 ENCOUNTER — Encounter (HOSPITAL_COMMUNITY): Payer: Self-pay | Admitting: Licensed Clinical Social Worker

## 2023-03-18 ENCOUNTER — Telehealth (HOSPITAL_COMMUNITY): Payer: Self-pay | Admitting: Licensed Clinical Social Worker

## 2023-03-18 NOTE — Telephone Encounter (Signed)
 03/18/23 discharged letter sent to patient for non-compliance of not keeping scheduled appointments./sh Certified mail [phone]

## 2023-04-05 ENCOUNTER — Telehealth (HOSPITAL_COMMUNITY): Payer: Self-pay

## 2023-04-05 DIAGNOSIS — F331 Major depressive disorder, recurrent, moderate: Secondary | ICD-10-CM

## 2023-04-05 MED ORDER — FLUOXETINE HCL 40 MG PO CAPS: 80.0000 mg | ORAL_CAPSULE | Freq: Every day | ORAL | 0 refills | Status: DC

## 2023-04-05 MED ORDER — TRAZODONE HCL 50 MG PO TABS: 50.0000 mg | ORAL_TABLET | Freq: Every evening | ORAL | 0 refills | Status: DC | PRN

## 2023-04-05 MED ORDER — HYDROXYZINE HCL 10 MG PO TABS: 10.0000 mg | ORAL_TABLET | Freq: Three times a day (TID) | ORAL | 0 refills | Status: DC | PRN

## 2023-04-05 NOTE — Telephone Encounter (Signed)
 Pts pharmacy is requesting for a refill on Hydroxyzine. Which shouldn't need a refill until next week.    Pt is also requesting for PROzac and Trazodone to be sent to be sent in as well.    JNL

## 2023-04-05 NOTE — Addendum Note (Signed)
 Addended by: Park Pope B on: 04/05/2023 12:36 PM   Modules accepted: Orders

## 2023-05-05 NOTE — Progress Notes (Deleted)
 BH MD Outpatient Progress Note Telephone Encounter  05/05/2023 4:22 PM Lance Chapman  MRN:  [id number]  Televisit via phone call: I connected with Lance Chapman on 02/20/23 at  3:30 PM EST by a telephone enabled telemedicine application and verified that I am speaking with the correct person using two identifiers.  Location: Patient: home in [place] Provider: office   I discussed the limitations of evaluation and management by telemedicine and the availability of in person appointments. The patient expressed understanding and agreed to proceed.  I discussed the assessment and treatment plan with the patient. The patient was provided an opportunity to ask questions and all were answered. The patient agreed with the plan and demonstrated an understanding of the instructions.   The patient was advised to call back or seek an in-person evaluation if the symptoms worsen or if the condition fails to improve as anticipated.  Assessment:  Lance Chapman presents for follow-up evaluation in-person. On initial visit, patient presented due to GAD, MDD, and caregiver burden that stemmed from aiding with mother who had suffered from Alzheimer's Disease and lack of companionship.  Today, 05/05/23, patient reports overall feeling that his symptoms of depression and anxiety have fluctuated but are manageable.  He denies any somatic complaints from fluoxetine .  He does not regularly use the trazodone  and hydroxyzine  but does appreciate having them available as they have been previously effective for management of his insomnia and anxiety respectively.  He endorsed some insomnia related to waking up in the middle of the night to which we discussed sleep hygiene which patient will review and attempt to apply interventions.  There is also the possibility that this is related to him waking up previously in the middle of the night to take care of mom with dementia.  Plan to continue psychotropic as  prescribed.  He sees a grief counselor with regards to loss of mom last year as well as is seeking regular psychotherapy in order to discuss other strategies to manage his anxiety.   Identifying Information: Lance Chapman is a 33. y.o. male with a history of GAD, MDD who is an established patient with [facility] for medication management.   Plan: # Generalized Anxiety Disorder Status of problem: Improving Interventions: -- Continue fluoxetine  80 mg mg for depression and anxiety -- Continue trazodone  50 mg at bedtime prn for insomnia -- Continue hydroxyzine  10 mg tid prn for anxiety -- Refer for psychotherapy   # Major depressive Disorder-moderate Status of problem: improving Interventions: -- Continue prozac  and trazodone  as above -- Continue psychotherapy   #Grief -Grief counselor meeting with patient regularly  Patient was given contact information for behavioral health clinic and was instructed to call 911 for emergencies.   Subjective:  Chief Complaint:  Medication Management  Interval History:  Patient reports past few months he has been doing well.  He reports that he has his "ups and downs" but states that overall his mood has been manageable.  He reports some anxiety related to his medical health but feels confident that he is working to improve it.  He endorses some symptoms of orthostasis and may be related to his antihypertensive.  He reports that he is also working to manage his neuropathy, diabetes, and attempting to lose weight as well.  He reports being compliant with his fluoxetine  and denies any somatic complaints from recent increase.  He reports eating well.  He reports sleeping has been difficult at times due to  waking up in the around 1 to 3 AM and having difficulty falling back asleep.  We discussed sleep hygiene including not being in bed when not fatigued, not watching TV in the middle of the night, and reducing any types of artificial  light and may be on around his house.  He was amenable to attempting these interventions.  He reports significant family support as they regularly check in with him. He would like to continue psychotropics as prescribed given he has found his anxiety and depression are manageable at this time. He is still looking for therapy to discuss his desire for companionship.   Visit Diagnosis:  No diagnosis found.      Past Medical History:  Past Medical History:  Diagnosis Date   Anxiety    Back pain, chronic    Depression    Hypertension    UTI (lower urinary tract infection)    No past surgical history on file.  Family History:  Family History  Problem Relation Age of Onset   Depression Mother    Dementia Mother     Social History:  Social History   Socioeconomic History   Marital status: Single    Spouse name: Not on file   Number of children: Not on file   Years of education: Not on file   Highest education level: Not on file  Occupational History   Not on file  Tobacco Use   Smoking status: Never   Smokeless tobacco: Never  Substance and Sexual Activity   Alcohol use: No   Drug use: No   Sexual activity: Not on file  Other Topics Concern   Not on file  Social History Narrative   Not on file   Social Drivers of Health   Financial Resource Strain: Low Risk  (02/14/2023)   Received from [facility]   Overall Financial Resource Strain (CARDIA)    Difficulty of Paying Living Expenses: Not hard at all  Food Insecurity: No Food Insecurity (02/14/2023)   Received from [facility]   Hunger Vital Sign    Worried About Running Out of Food in the Last Year: Never true    Ran Out of Food in the Last Year: Never true  Transportation Needs: No Transportation Needs (02/14/2023)   Received from [facility] - Transportation    Lack of Transportation (Medical): No    Lack of Transportation (Non-Medical): No  Physical Activity: Sufficiently Active (02/14/2023)    Received from [facility]   Exercise Vital Sign    Days of Exercise per Week: 5 days    Minutes of Exercise per Session: 60 min  Stress: Stress Concern Present (02/14/2023)   Received from [facility] - Occupational Stress Questionnaire    Feeling of Stress : To some extent  Social Connections: Socially Integrated (02/14/2023)   Received from [facility]   Social Network    How would you rate your social network (family, work, friends)?: Good participation with social networks    Allergies:  Allergies  Allergen Reactions   Bee Venom Anaphylaxis   Benadryl Allergy [Diphenhydramine] Anaphylaxis    ROS: All other ROS negative besides those noted in interval history  Objective: Psychiatric Specialty Exam: There were no vitals taken for this visit.There is no height or weight on file to calculate BMI. General Appearance: Well Groomed  Eye Contact:  Good  Speech:  Clear and Coherent and Normal Rate  Volume:  Normal  Mood:  Euthymic  Affect:  Appropriate and Congruent  Thought Process:  Coherent, Goal Directed, and Linear  Orientation:  Full (Time, Place, and Person)  Thought Content: Logical   Suicidal Thoughts:  No  Homicidal Thoughts:  No  Memory:  Remote;   Good  Judgment:  Fair  Insight:  Fair  Psychomotor Activity:  Normal  Concentration:  Concentration: Good and Attention Span: Good Assets:  Communication Skills Desire for Improvement Financial Resources/Insurance Housing Leisure Time Physical Health Resilience Social Support Talents/Skills Transportation  ADL's:  Intact  Cognition: WNL  Sleep:  Good      PE: General: well-appearing; no acute distress  Pulm: no increased work of breathing on room air  Strength & Muscle Tone: within normal limits Neuro: no focal neurological deficits observed  Gait & Station: normal  Metabolic Disorder Labs: Lab Results  Component Value Date   HGBA1C 8.0 (H) 11/17/2020    MPG 182.9 11/17/2020   MPG 159.94 05/13/2020   Lab Results  Component Value Date   PROLACTIN 8.6 11/17/2020   PROLACTIN QUANTITY NOT SUFFICIENT, UNABLE TO PERFORM TEST 05/13/2020   Lab Results  Component Value Date   CHOL 236 (H) 11/17/2020   TRIG 116 11/17/2020   HDL 60 11/17/2020   CHOLHDL 3.9 11/17/2020   VLDL 23 11/17/2020   LDLCALC 153 (H) 11/17/2020   LDLCALC 92 05/13/2020   Lab Results  Component Value Date   TSH 0.764 11/17/2020   TSH 1.528 05/13/2020    Therapeutic Level Labs: No results found for: "LITHIUM" No results found for: "VALPROATE" No results found for: "CBMZ"  Screenings: GAD-7    Flowsheet Row Clinical Support from 07/19/2021 in [facility] Counselor from 07/10/2021 in [facility] Clinical Support from 11/17/2020 in [facility] Counselor from 09/12/2020 in [facility] Clinical Support from 08/16/2020 in [facility]  Total GAD-7 Score 9 11 17 16 16       PHQ2-9    Flowsheet Row Clinical Support from 10/19/2021 in [facility] Clinical Support from 07/19/2021 in [facility] Counselor from 07/10/2021 in [facility] Clinical Support from 11/17/2020 in [facility] Counselor from 09/12/2020 in [facility]  PHQ-2 Total Score 5 5 2 6 4   PHQ-9 Total Score 15 11 11 21 19       Flowsheet Row ED from 08/13/2021 in [facility] Emergency Department at [facility] ED from 04/25/2021 in [facility] Emergency Department at [facility] ED from 11/17/2020 in [facility]  C-SSRS RISK CATEGORY No Risk No Risk Error: Q3, 4, or 5 should not be populated when Q2 is No        Augusta Blizzard, MD 05/05/2023, 4:22 PM

## 2023-05-06 ENCOUNTER — Telehealth (HOSPITAL_COMMUNITY): Payer: Self-pay | Admitting: Student

## 2023-05-06 ENCOUNTER — Telehealth (HOSPITAL_COMMUNITY): Admitting: Student

## 2023-05-06 ENCOUNTER — Encounter (HOSPITAL_COMMUNITY): Payer: Self-pay | Admitting: Student

## 2023-05-06 NOTE — Telephone Encounter (Signed)
 Attempted to call patient regarding today's appointment. Phone rang but went to VM but VM was full. Patient will need to reschedule.

## 2023-05-09 ENCOUNTER — Telehealth (HOSPITAL_COMMUNITY): Payer: Self-pay | Admitting: *Deleted

## 2023-05-09 NOTE — Telephone Encounter (Signed)
 Fax request from [facility] pharmacy for patients atarax . He should be nearly out. He has missed his last two appts last seen on 02/20/23 and has no future appt scheduled. I will forward this request to his provider for him to consider his request. I will also send message to front desk to give him a future appt.

## 2023-05-11 ENCOUNTER — Encounter (HOSPITAL_COMMUNITY): Payer: Self-pay | Admitting: Student

## 2023-05-11 DIAGNOSIS — F331 Major depressive disorder, recurrent, moderate: Secondary | ICD-10-CM

## 2023-05-11 MED ORDER — HYDROXYZINE HCL 10 MG PO TABS: 10.0000 mg | ORAL_TABLET | Freq: Three times a day (TID) | ORAL | 0 refills | Status: AC | PRN

## 2023-05-12 NOTE — Progress Notes (Unsigned)
 This encounter was created in error - please disregard.

## 2023-05-13 ENCOUNTER — Encounter (HOSPITAL_COMMUNITY): Payer: Self-pay

## 2023-05-13 ENCOUNTER — Encounter (HOSPITAL_COMMUNITY): Admitting: Student

## 2023-05-13 ENCOUNTER — Encounter (HOSPITAL_COMMUNITY): Payer: Self-pay | Admitting: Student

## 2023-05-20 ENCOUNTER — Telehealth (HOSPITAL_COMMUNITY): Payer: 59 | Admitting: Student

## 2023-06-17 ENCOUNTER — Encounter (HOSPITAL_COMMUNITY): Payer: Self-pay | Admitting: Student

## 2023-06-17 ENCOUNTER — Ambulatory Visit (HOSPITAL_BASED_OUTPATIENT_CLINIC_OR_DEPARTMENT_OTHER): Admitting: Student

## 2023-06-17 DIAGNOSIS — F331 Major depressive disorder, recurrent, moderate: Secondary | ICD-10-CM

## 2023-06-17 MED ORDER — FLUOXETINE HCL 40 MG PO CAPS: 80.0000 mg | ORAL_CAPSULE | Freq: Every day | ORAL | 0 refills | Status: AC

## 2023-06-17 MED ORDER — TRAZODONE HCL 50 MG PO TABS: 50.0000 mg | ORAL_TABLET | Freq: Every evening | ORAL | 0 refills | Status: AC | PRN

## 2023-06-17 NOTE — Progress Notes (Signed)
 BH MD Outpatient Progress Note  06/17/2023 5:08 PM Lance Chapman  MRN:  [id number]  Assessment:  Lance Chapman presents for follow-up evaluation in-person. On initial visit, patient presented due to GAD, MDD, and caregiver burden that stemmed from aiding with mother who had suffered from Alzheimer's Disease and lack of companionship. Her mom had passed away 07-31-22. He has recently been struggling with diabetic neuropathy and shingles.   Today, 06/17/23, patient reports overall feeling that his symptoms of depression and anxiety have fluctuated but are manageable.  Recently, primary stressors have been handling diabetic neuropathy and shingles.  He denies any somatic complaints from fluoxetine .  He uses hydroxyzine  approximately twice a week then trazodone  sporadically to aid with sleep given ongoing pain secondary to shingles.  He does not regularly use the trazodone  and hydroxyzine  but does appreciate having them available as they have been previously effective for management of his insomnia and anxiety respectively.  Plan to continue psychotropic as prescribed.    Identifying Information: Lance Chapman is a 68. y.o. male with a history of GAD, MDD who is an established patient with [facility] for medication management.   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   Subjective:  Chief Complaint:   Medication Management  Interval History:  Patient reports past few months has been of this more difficult than before given ongoing medical problems including diabetic neuropathy in feet as well as struggling with pain secondary to shingles.  He reports his sleep had recently worsened because of the pain from laying on lesions around his left side. Trazodone  greatly helps with this. He reports overall his mood and anxiety has been manageable. He would like to continue psychotropics as prescribed given he has found his anxiety and depression are manageable at this time. He is currently in a relationship.   Visit Diagnosis:    ICD-10-CM   1. 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  Family History:  Family History  Problem Relation Age of Onset   Depression Mother    Dementia Mother     Social History:  Social History   Socioeconomic History   Marital status: Single    Spouse name: Not on file   Number of children: Not on file   Years of education: Not on file   Highest education level: Not on file  Occupational History   Not on file  Tobacco Use   Smoking status: Never   Smokeless tobacco: Never  Substance and Sexual Activity   Alcohol use: No   Drug use: No   Sexual activity: Not on file  Other Topics Concern   Not on file  Social History Narrative   Not on file   Social Drivers of Health   Financial Resource Strain: Low Risk  (02/14/2023)   Received from [facility]   Overall Financial Resource Strain (CARDIA)    Difficulty of Paying Living Expenses: Not hard at all  Food Insecurity: No Food Insecurity (02/14/2023)   Received from [facility]   Hunger Vital Sign     Within the past 12 months, you worried that your food would run out before you got the money to buy more.: Never true    Within the past 12 months, the food you bought just didn't last and you didn't have money to get more.: Never true  Transportation Needs: No Transportation Needs (02/14/2023)   Received from [facility] - Transportation    Lack of Transportation (Medical): No    Lack of Transportation (Non-Medical): No  Physical Activity: Sufficiently Active (02/14/2023)   Received from [facility]   Exercise Vital Sign    On average, how many days per week do you engage in moderate to strenuous exercise (like a brisk walk)?: 5 days    On average, how many minutes do you engage in exercise at this level?: 60 min  Stress: Stress Concern Present (02/14/2023)   Received from [facility] - Occupational Stress Questionnaire    Feeling of Stress : To some extent  Social Connections: Socially Integrated (02/14/2023)   Received from [facility]   Social Network    How would you rate your social network (family, work, friends)?: Good participation with social networks    Allergies:  Allergies  Allergen Reactions   Bee Venom Anaphylaxis   Benadryl Allergy [Diphenhydramine] Anaphylaxis    ROS: All other ROS negative besides those noted in interval history  Objective: Psychiatric Specialty Exam: There were no vitals taken for this visit.There is no height or weight on file to calculate BMI. General Appearance: Well Groomed  Eye Contact:  Good  Speech:  Clear and Coherent and Normal Rate  Volume:  Normal  Mood:  Euthymic  Affect:  Appropriate and Congruent  Thought Process:  Coherent, Goal Directed, and Linear  Orientation:  Full (Time, Place, and Person)  Thought Content: Logical   Suicidal Thoughts:  No  Homicidal Thoughts:  No  Memory:  Remote;   Good  Judgment:  Fair  Insight:  Fair  Psychomotor Activity:  Normal   Concentration:  Concentration: Good and Attention Span: Good Assets:  Communication Skills Desire for Improvement Financial Resources/Insurance Housing Leisure Time Physical Health Resilience Social Support Talents/Skills Transportation  ADL's:  Intact  Cognition: WNL  Sleep:  Good      PE: General: well-appearing; no acute distress  Pulm: no increased work of breathing on room air  Strength & Muscle Tone: within normal limits Neuro: no focal neurological deficits observed  Gait & Station: normal  Metabolic Disorder Labs: Lab Results  Component Value Date   HGBA1C 8.0 (H) 11/17/2020   MPG 182.9 11/17/2020   MPG 159.94 05/13/2020   Lab Results  Component Value Date   PROLACTIN 8.6 11/17/2020   PROLACTIN QUANTITY NOT SUFFICIENT, UNABLE TO PERFORM TEST 05/13/2020   Lab Results  Component Value Date   CHOL 236 (H) 11/17/2020   TRIG 116 11/17/2020   HDL 60 11/17/2020   CHOLHDL 3.9 11/17/2020   VLDL 23 11/17/2020   LDLCALC 153 (H) 11/17/2020  LDLCALC 92 05/13/2020   Lab Results  Component Value Date   TSH 0.764 11/17/2020   TSH 1.528 05/13/2020    Therapeutic Level Labs: No results found for: LITHIUM No results found for: VALPROATE No results found for: CBMZ  Screenings: GAD-7    Flowsheet Row Clinical Support from 07/19/2021 in [facility] Counselor from 07/10/2021 in [facility] Clinical Support from 11/17/2020 in [facility] Counselor from 09/12/2020 in [facility] Clinical Support from 08/16/2020 in [facility]  Total GAD-7 Score 9 11 17 16 16    PHQ2-9    Flowsheet Row Clinical Support from 10/19/2021 in [facility] Clinical Support from 07/19/2021 in [facility] Counselor from 07/10/2021 in [facility] Clinical Support  from 11/17/2020 in [facility] Counselor from 09/12/2020 in [facility]  PHQ-2 Total Score 5 5 2 6 4   PHQ-9 Total Score 15 11 11 21 19    Flowsheet Row ED from 08/13/2021 in [facility] Emergency Department at [facility] ED from 04/25/2021 in [facility] Emergency Department at [facility] ED from 11/17/2020 in [facility]  C-SSRS RISK CATEGORY No Risk No Risk Error: Q3, 4, or 5 should not be populated when Q2 is No     Augusta Blizzard, MD 06/17/2023, 5:08 PM

## 2023-06-19 NOTE — Addendum Note (Signed)
 Addended by: Donnelly Gainer on: 06/19/2023 09:45 AM   Modules accepted: Level of Service

## 2023-08-05 NOTE — Progress Notes (Deleted)
 BH MD Outpatient Progress Note  08/05/2023 11:28 AM Lance Chapman  MRN:  [id number]  Assessment:  Lance Chapman presents for follow-up evaluation in-person. On initial visit, patient presented due to GAD, MDD, and caregiver burden that stemmed from aiding with mother who had suffered from Alzheimer's Disease and lack of companionship. Her mom had passed away 07/20/22. He has recently been struggling with diabetic neuropathy and shingles.   Today, 08/05/23, patient reports overall feeling that his symptoms of depression and anxiety have fluctuated but are manageable.  Recently, primary stressors have been handling diabetic neuropathy and shingles.  He denies any somatic complaints from fluoxetine .  He uses hydroxyzine  approximately twice a week then trazodone  sporadically to aid with sleep given ongoing pain secondary to shingles.  He does not regularly use the trazodone  and hydroxyzine  but does appreciate having them available as they have been previously effective for management of his insomnia and anxiety respectively.  Plan to continue psychotropic as prescribed.    Identifying Information: Lance Chapman is a 74. y.o. male with a history of GAD, MDD who is an established patient with [facility] for medication management.   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   Subjective:  Chief Complaint:   Medication Management  Interval History:  Patient reports past few months has been of this more difficult than before given ongoing medical problems including diabetic neuropathy in feet as well as struggling with pain secondary to shingles.  He reports his sleep had recently worsened because of the pain from laying on lesions around his left side. Trazodone  greatly helps with this. He reports overall his mood and anxiety has been manageable. He would like to continue psychotropics as prescribed given he has found his anxiety and depression are manageable at this time. He is currently in a relationship.   Visit Diagnosis:  No diagnosis found.       Past Medical History:  Past Medical History:  Diagnosis Date   Anxiety    Back pain, chronic    Depression    Hypertension    UTI (lower urinary tract infection)    No past surgical history on file.  Family History:  Family History  Problem Relation Age of Onset   Depression Mother    Dementia Mother     Social History:  Social History   Socioeconomic History   Marital status: Single    Spouse name: Not on file   Number of children: Not on file   Years of education: Not on file   Highest education level: Not on file  Occupational History   Not on file  Tobacco Use   Smoking status: Never   Smokeless tobacco: Never  Substance and Sexual Activity   Alcohol use: No   Drug use: No   Sexual activity: Not on file  Other Topics Concern   Not on file  Social History Narrative   Not on file   Social Drivers of Health  Financial Resource Strain: Low Risk  (02/14/2023)   Received from [facility]   Overall Financial Resource Strain (CARDIA)    Difficulty of Paying Living Expenses: Not hard at all  Food Insecurity: No Food Insecurity (02/14/2023)   Received from [facility]   Hunger Vital Sign    Within the past 12 months, you worried that your food would run out before you got the money to buy more.: Never true     Within the past 12 months, the food you bought just didn't last and you didn't have money to get more.: Never true  Transportation Needs: No Transportation Needs (02/14/2023)   Received from [facility] - Transportation    Lack of Transportation (Medical): No    Lack of Transportation (Non-Medical): No  Physical Activity: Sufficiently Active (02/14/2023)   Received from [facility]   Exercise Vital Sign    On average, how many days per week do you engage in moderate to strenuous exercise (like a brisk walk)?: 5 days    On average, how many minutes do you engage in exercise at this level?: 60 min  Stress: Stress Concern Present (02/14/2023)   Received from [facility] - Occupational Stress Questionnaire    Feeling of Stress : To some extent  Social Connections: Socially Integrated (02/14/2023)   Received from [facility]   Social Network    How would you rate your social network (family, work, friends)?: Good participation with social networks    Allergies:  Allergies  Allergen Reactions   Bee Venom Anaphylaxis   Benadryl Allergy [Diphenhydramine] Anaphylaxis    ROS: All other ROS negative besides those noted in interval history  Objective: Psychiatric Specialty Exam: There were no vitals taken for this visit.There is no height or weight on file to calculate BMI. General Appearance: Well Groomed  Eye Contact:  Good  Speech:  Clear and Coherent and Normal Rate  Volume:  Normal  Mood:  Euthymic  Affect:  Appropriate and Congruent  Thought Process:  Coherent, Goal Directed, and Linear  Orientation:  Full (Time, Place, and Person)  Thought Content: Logical   Suicidal Thoughts:  No  Homicidal Thoughts:  No  Memory:  Remote;   Good  Judgment:  Fair  Insight:  Fair  Psychomotor Activity:  Normal  Concentration:  Concentration: Good and Attention Span: Good Assets:  Communication Skills Desire for  Improvement Financial Resources/Insurance Housing Leisure Time Physical Health Resilience Social Support Talents/Skills Transportation  ADL's:  Intact  Cognition: WNL  Sleep:  Good      PE: General: well-appearing; no acute distress  Pulm: no increased work of breathing on room air  Strength & Muscle Tone: within normal limits Neuro: no focal neurological deficits observed  Gait & Station: normal  Metabolic Disorder Labs: Lab Results  Component Value Date   HGBA1C 8.0 (H) 11/17/2020   MPG 182.9 11/17/2020   MPG 159.94 05/13/2020   Lab Results  Component Value Date   PROLACTIN 8.6 11/17/2020   PROLACTIN QUANTITY NOT SUFFICIENT, UNABLE TO PERFORM TEST 05/13/2020   Lab Results  Component Value Date   CHOL 236 (H) 11/17/2020   TRIG 116 11/17/2020   HDL 60 11/17/2020   CHOLHDL 3.9 11/17/2020   VLDL 23 11/17/2020   LDLCALC 153 (H) 11/17/2020   LDLCALC 92 05/13/2020   Lab Results  Component Value Date   TSH 0.764 11/17/2020   TSH 1.528 05/13/2020    Therapeutic  Level Labs: No results found for: LITHIUM No results found for: VALPROATE No results found for: CBMZ  Screenings: GAD-7    Flowsheet Row Clinical Support from 07/19/2021 in [facility] 11/17/2020 in [facility] Counselor from 09/12/2020 in [facility] Clinical Support from 08/16/2020 in [facility]  Total GAD-7 Score 9 11 17 16 16    PHQ2-9    Flowsheet Row Clinical Support from 10/19/2021 in [facility] Clinical Support from 07/19/2021 in [facility] Counselor from 07/10/2021 in [facility] Clinical Support from 11/17/2020 in [facility] Counselor from 09/12/2020 in [facility]  PHQ-2 Total Score 5 5 2 6 4   PHQ-9 Total Score 15 11 11 21 19    Flowsheet Row ED from 08/13/2021 in [facility] Emergency Department at [facility] ED from 04/25/2021 in [facility] Emergency Department at [facility] ED from 11/17/2020 in [facility]  C-SSRS RISK CATEGORY No Risk No Risk Error: Q3, 4, or 5 should not be populated when Q2 is No     Callum Wolf, MD 08/05/2023, 11:28 AM

## 2023-08-14 ENCOUNTER — Ambulatory Visit (HOSPITAL_COMMUNITY)
Admission: EM | Admit: 2023-08-14 | Discharge: 2023-08-15 | Disposition: A | Discharge status: Other Acute Inpatient Hospital | Attending: Nurse Practitioner | Admitting: Nurse Practitioner

## 2023-08-14 DIAGNOSIS — Z634 Disappearance and death of family member: Secondary | ICD-10-CM | POA: Insufficient documentation

## 2023-08-14 DIAGNOSIS — Z7985 Long-term (current) use of injectable non-insulin antidiabetic drugs: Secondary | ICD-10-CM | POA: Diagnosis not present

## 2023-08-14 DIAGNOSIS — F411 Generalized anxiety disorder: Secondary | ICD-10-CM | POA: Diagnosis not present

## 2023-08-14 DIAGNOSIS — Z7984 Long term (current) use of oral hypoglycemic drugs: Secondary | ICD-10-CM | POA: Insufficient documentation

## 2023-08-14 DIAGNOSIS — F332 Major depressive disorder, recurrent severe without psychotic features: Secondary | ICD-10-CM | POA: Diagnosis not present

## 2023-08-14 DIAGNOSIS — F4381 Prolonged grief disorder: Secondary | ICD-10-CM | POA: Diagnosis not present

## 2023-08-14 DIAGNOSIS — F4329 Adjustment disorder with other symptoms: Secondary | ICD-10-CM | POA: Diagnosis not present

## 2023-08-14 DIAGNOSIS — Z794 Long term (current) use of insulin: Secondary | ICD-10-CM | POA: Insufficient documentation

## 2023-08-14 DIAGNOSIS — F4323 Adjustment disorder with mixed anxiety and depressed mood: Secondary | ICD-10-CM | POA: Insufficient documentation

## 2023-08-14 DIAGNOSIS — F439 Reaction to severe stress, unspecified: Secondary | ICD-10-CM

## 2023-08-14 DIAGNOSIS — I10 Essential (primary) hypertension: Secondary | ICD-10-CM | POA: Insufficient documentation

## 2023-08-14 DIAGNOSIS — E119 Type 2 diabetes mellitus without complications: Secondary | ICD-10-CM | POA: Diagnosis not present

## 2023-08-14 MED ORDER — INSULIN DEGLUDEC 100 UNIT/ML ~~LOC~~ SOPN: 20.0000 [IU] | PEN_INJECTOR | Freq: Every day | SUBCUTANEOUS | Status: DC

## 2023-08-14 MED ORDER — INSULIN ASPART 100 UNIT/ML IJ SOLN
0.0000 [IU] | Freq: Three times a day (TID) | INTRAMUSCULAR | Status: DC
  Administered 2023-08-15: 2 [IU] via SUBCUTANEOUS

## 2023-08-14 MED ORDER — OLANZAPINE 5 MG PO TBDP: 5.0000 mg | ORAL_TABLET | Freq: Three times a day (TID) | ORAL | Status: DC | PRN

## 2023-08-14 MED ORDER — OLANZAPINE 10 MG IM SOLR: 5.0000 mg | Freq: Three times a day (TID) | INTRAMUSCULAR | Status: DC | PRN

## 2023-08-14 MED ORDER — INSULIN GLARGINE-YFGN 100 UNIT/ML ~~LOC~~ SOLN: 20.0000 [IU] | Freq: Every day | SUBCUTANEOUS | Status: DC

## 2023-08-14 MED ORDER — LISINOPRIL 20 MG PO TABS: 20.0000 mg | ORAL_TABLET | Freq: Every day | ORAL | Status: DC

## 2023-08-14 MED ORDER — HYDROCHLOROTHIAZIDE 12.5 MG PO TABS: 12.5000 mg | ORAL_TABLET | Freq: Every day | ORAL | Status: DC

## 2023-08-14 MED ORDER — ALUM & MAG HYDROXIDE-SIMETH 200-200-20 MG/5ML PO SUSP: 30.0000 mL | ORAL | Status: DC | PRN

## 2023-08-14 MED ORDER — MELATONIN 3 MG PO TABS
3.0000 mg | ORAL_TABLET | Freq: Every evening | ORAL | Status: DC | PRN
  Administered 2023-08-15: 3 mg via ORAL
  Filled 2023-08-14: qty 1

## 2023-08-14 MED ORDER — GABAPENTIN 100 MG PO CAPS
100.0000 mg | ORAL_CAPSULE | Freq: Every day | ORAL | Status: DC
  Administered 2023-08-15: 100 mg via ORAL
  Filled 2023-08-14: qty 1

## 2023-08-14 MED ORDER — HYDROXYZINE HCL 25 MG PO TABS
25.0000 mg | ORAL_TABLET | Freq: Three times a day (TID) | ORAL | Status: DC | PRN
  Administered 2023-08-15: 25 mg via ORAL
  Filled 2023-08-14: qty 1

## 2023-08-14 MED ORDER — ACETAMINOPHEN 325 MG PO TABS: 650.0000 mg | ORAL_TABLET | Freq: Four times a day (QID) | ORAL | Status: DC | PRN

## 2023-08-14 MED ORDER — MAGNESIUM HYDROXIDE 400 MG/5ML PO SUSP: 30.0000 mL | Freq: Every day | ORAL | Status: DC | PRN

## 2023-08-14 MED ORDER — EMPAGLIFLOZIN 10 MG PO TABS
10.0000 mg | ORAL_TABLET | Freq: Every day | ORAL | Status: DC
  Administered 2023-08-15: 10 mg via ORAL
  Filled 2023-08-14: qty 1

## 2023-08-14 MED ORDER — INSULIN ASPART 100 UNIT/ML IJ SOLN: 0.0000 [IU] | Freq: Every day | INTRAMUSCULAR | Status: DC

## 2023-08-14 MED ORDER — LISINOPRIL-HYDROCHLOROTHIAZIDE 20-12.5 MG PO TABS: 1.0000 | ORAL_TABLET | Freq: Every day | ORAL | Status: DC
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
## 2023-08-14 NOTE — ED Provider Notes (Signed)
 [facility] Continuous Assessment Admission H&P  Date: 08/15/23 Patient Name: Lance Chapman MRN: [id number] Chief Complaint: I feel depressed and anxious about everything  Diagnoses:  Final diagnoses:  Severe episode of recurrent major depressive disorder, without psychotic features (HCC)  GAD (generalized anxiety disorder)  Grief reaction with prolonged bereavement  Adjustment disorder with mixed anxiety and depressed mood  Stress    HPI: Lance Krisher. Chapman is a 58 year old male with psychiatric history of anxiety, depression, SI, and medical history of HTN and uncontrolled type 2 diabetes, who presented voluntarily as a walk in to [facility] accompanied by his brother, with complaints of stress, worsening depressive and anxiety symptoms and prolonged grief.  Patient was seen face to face by this provider and chart reviewed.  Per chart review. Patient was [facility] due to SI with a plan to OD on sleeping pills due to caregiver stress for his 71 years old mother at the time. She is now deceased.  Patient reports  I just have a lot of anxiety, and I feel very depressed, been dealing with it for a couple of years, I've been seeing Dr Prentice Espy at [facility], last couple of months, just feeling overwhelmed and stressed since I lost my mom a year ago, dealing with things at home, I live alone, the responsibility of things, I'm disabled, couldn't do the things I used to do, I have no thoughts of harming myself, I just need someone to talk to, I'm diabetic, with health problems trying to get back into the gym, I can't go because I don't have transportation, I'm dealing with neuropathy, I'm wearing compression stockings , plus I have shingles since march, I went to the doctor and she gave me medication, and I've been trying to cope with my mom's passing, I'm getting phone calls from a grief counselor since last year, which is helping some, I have three brothers and  1 sister who lives out of state.   Patient is unable to recall his psychiatric meds, but states he takes a total of five pills daily, including Jardiance  and insulin  shots. Patient endorses worsening depressive symptoms, including low mood, sleep alteration, loss of interest in pleasurable activities, feelings of guilt/worthlessness/hopelessness, problems with energy, problems with concentration.  He denies illicit substance use. He denies alcohol use.  He reports feeling unsafe with himself and is seeking inpatient psychiatric hospitalization for stabilization and treatment of his symptoms  On evaluation, patient is alert, oriented x 3, and cooperative. Speech is clear and coherent. Pt appears fairly groomed. Eye contact is fair. Mood is anxious and depressed, affect is congruent with mood. Thought process is coherent and thought content is WDL. Pt denies SI/HI/AVH. There is no objective indication that the patient is responding to internal stimuli. No delusions elicited during this assessment.    Discussed recommendation for inpatient psychiatric admission for stabilization and treatment.   Discussed inpatient milieu and expectations.  Patient provided with opportunity for questions.  He verbalized understanding and is in agreement. Patient admitted to the continuous observation unit for safety monitoring pending transfer to inpatient psychiatric unit. LCSW will seek bed placement.    Total Time spent with patient: 30 minutes  Musculoskeletal  Strength & Muscle Tone: within normal limits Gait & Station: normal Patient leans: N/A  Psychiatric Specialty Exam  Presentation General Appearance:  Appropriate for Environment; Fairly Groomed  Eye Contact: Fair  Speech: Clear and Coherent  Speech Volume: Decreased  Handedness: Right   Mood and Affect  Mood: Depressed; Anxious  Affect: Congruent   Thought Process  Thought Processes: Coherent  Descriptions of  Associations:Intact  Orientation:Full (Time, Place and Person)  Thought Content:WDL  Diagnosis of Schizophrenia or Schizoaffective disorder in past: No   Hallucinations:Hallucinations: None  Ideas of Reference:None  Suicidal Thoughts:Suicidal Thoughts: No  Homicidal Thoughts:Homicidal Thoughts: No   Sensorium  Memory: Immediate Fair  Judgment: Intact  Insight: Present   Executive Functions  Concentration: Good  Attention Span: Good  Recall: Fair  Fund of Knowledge: Fair  Language: Good   Psychomotor Activity  Psychomotor Activity: Psychomotor Activity: Normal   Assets  Assets: Communication Skills; Desire for Improvement   Sleep  Sleep: Sleep: Poor   Nutritional Assessment (For OBS and FBC admissions only) Has the patient had a weight loss or gain of 10 pounds or more in the last 3 months?: No Has the patient had a decrease in food intake/or appetite?: No Does the patient have dental problems?: No Does the patient have eating habits or behaviors that may be indicators of an eating disorder including binging or inducing vomiting?: No Has the patient recently lost weight without trying?: 0 Has the patient been eating poorly because of a decreased appetite?: 0 Malnutrition Screening Tool Score: 0    Physical Exam Constitutional:      General: He is not in acute distress.    Appearance: He is not diaphoretic.  HENT:     Nose: No congestion.  Cardiovascular:     Rate and Rhythm: Normal rate.  Pulmonary:     Effort: No respiratory distress.  Chest:     Chest wall: No tenderness.  Neurological:     Mental Status: He is alert and oriented to person, place, and time.  Psychiatric:        Attention and Perception: Attention and perception normal.        Mood and Affect: Mood is anxious and depressed.        Speech: Speech normal.        Behavior: Behavior is cooperative.        Thought Content: Thought content normal.    Review of  Systems  Constitutional:  Negative for chills, diaphoresis and fever.  HENT:  Negative for congestion.   Eyes:  Negative for discharge.  Respiratory:  Negative for cough, shortness of breath and wheezing.   Cardiovascular:  Negative for chest pain and palpitations.  Gastrointestinal:  Negative for diarrhea, nausea and vomiting.  Neurological:  Negative for dizziness, seizures, weakness and headaches.  Psychiatric/Behavioral:  Positive for depression. The patient is nervous/anxious.     Blood pressure (!) 142/92, pulse 78, temperature 97.9 F (36.6 C), temperature source Oral, resp. rate 20, SpO2 99%. There is no height or weight on file to calculate BMI.  Past Psychiatric History: See H & P   Is the patient at risk to self? Yes  Has the patient been a risk to self in the past 6 months? Yes .    Has the patient been a risk to self within the distant past? Yes   Is the patient a risk to others? No   Has the patient been a risk to others in the past 6 months? No   Has the patient been a risk to others within the distant past? No   Past Medical History: See Chart  Family History: N/A  Social History: N/A  Last Labs:  Admission on 08/14/2023  Component Date Value  Ref Range Status   WBC 08/14/2023 8.3  4.0 - 10.5 K/uL Final   RBC 08/14/2023 5.05  4.22 - 5.81 MIL/uL Final   Hemoglobin 08/14/2023 15.4  13.0 - 17.0 g/dL Final   HCT 91/86/7974 44.9  39.0 - 52.0 % Final   MCV 08/14/2023 88.9  80.0 - 100.0 fL Final   MCH 08/14/2023 30.5  26.0 - 34.0 pg Final   MCHC 08/14/2023 34.3  30.0 - 36.0 g/dL Final   RDW 91/86/7974 11.7  11.5 - 15.5 % Final   Platelets 08/14/2023 333  150 - 400 K/uL Final   nRBC 08/14/2023 0.0  0.0 - 0.2 % Final   Neutrophils Relative % 08/14/2023 70  % Final   Neutro Abs 08/14/2023 5.9  1.7 - 7.7 K/uL Final   Lymphocytes Relative 08/14/2023 22  % Final   Lymphs Abs 08/14/2023 1.9  0.7 - 4.0 K/uL Final   Monocytes Relative 08/14/2023 5  % Final   Monocytes  Absolute 08/14/2023 0.4  0.1 - 1.0 K/uL Final   Eosinophils Relative 08/14/2023 1  % Final   Eosinophils Absolute 08/14/2023 0.1  0.0 - 0.5 K/uL Final   Basophils Relative 08/14/2023 1  % Final   Basophils Absolute 08/14/2023 0.0  0.0 - 0.1 K/uL Final   Immature Granulocytes 08/14/2023 1  % Final   Abs Immature Granulocytes 08/14/2023 0.04  0.00 - 0.07 K/uL Final   Performed at [facility], [address] [place], [place] [address]   Hgb A1c MFr Bld 08/14/2023 6.4 (H)  4.8 - 5.6 % Final   Comment: (NOTE) Diagnosis of Diabetes The following HbA1c ranges recommended by the American Diabetes Association (ADA) may be used as an aid in the diagnosis of diabetes mellitus.  Hemoglobin             Suggested A1C NGSP%              Diagnosis  <5.7                   Non Diabetic  5.7-6.4                Pre-Diabetic  >6.4                   Diabetic  <7.0                   Glycemic control for                       adults with diabetes.     Mean Plasma Glucose 08/14/2023 136.98  mg/dL Final   Performed at [facility], [address], [place], [place] [address]   POC Amphetamine UR 08/15/2023 None Detected  NONE DETECTED (Cut Off Level 1000 ng/mL) Final   POC Secobarbital (BAR) 08/15/2023 None Detected  NONE DETECTED (Cut Off Level 300 ng/mL) Final   POC Buprenorphine (BUP) 08/15/2023 None Detected  NONE DETECTED (Cut Off Level 10 ng/mL) Final   POC Oxazepam (BZO) 08/15/2023 None Detected  NONE DETECTED (Cut Off Level 300 ng/mL) Final   POC Cocaine UR 08/15/2023 None Detected  NONE DETECTED (Cut Off Level 300 ng/mL) Final   POC Methamphetamine UR 08/15/2023 None Detected  NONE DETECTED (Cut Off Level 1000 ng/mL) Final   POC Morphine 08/15/2023 None Detected  NONE DETECTED (Cut Off Level 300 ng/mL) Final   POC Methadone UR 08/15/2023 None Detected  NONE DETECTED (Cut Off  Level 300 ng/mL) Final   POC Oxycodone UR 08/15/2023 None Detected  NONE DETECTED (Cut Off Level 100 ng/mL)  Final   POC Marijuana UR 08/15/2023 None Detected  NONE DETECTED (Cut Off Level 50 ng/mL) Final   Glucose-Capillary 08/15/2023 151 (H)  70 - 99 mg/dL Final   Glucose reference range applies only to samples taken after fasting for at least 8 hours.   Comment 1 08/15/2023 Notify RN   Final    Allergies: Bee venom and Benadryl allergy [diphenhydramine]  Medications:  Facility Ordered Medications  Medication   acetaminophen  (TYLENOL ) tablet 650 mg   alum & mag hydroxide-simeth (MAALOX/MYLANTA) 200-200-20 MG/5ML suspension 30 mL   magnesium  hydroxide (MILK OF MAGNESIA) suspension 30 mL   OLANZapine  zydis (ZYPREXA ) disintegrating tablet 5 mg   OLANZapine  (ZYPREXA ) injection 5 mg   hydrOXYzine  (ATARAX ) tablet 25 mg   melatonin tablet 3 mg   empagliflozin  (JARDIANCE ) tablet 10 mg   gabapentin  (NEURONTIN ) capsule 100 mg   insulin  aspart (novoLOG ) injection 0-15 Units   insulin  aspart (novoLOG ) injection 0-5 Units   amLODipine  (NORVASC ) tablet 5 mg   PTA Medications  Medication Sig   atorvastatin  (LIPITOR) 20 MG tablet Take 20 mg by mouth daily.   TRESIBA  FLEXTOUCH 100 UNIT/ML FlexTouch Pen Inject 20 Units into the skin daily.   JARDIANCE  10 MG TABS tablet Take 10 mg by mouth daily.   OZEMPIC, 0.25 OR 0.5 MG/DOSE, 2 MG/1.5ML SOPN Inject 0.3 mg into the skin every Friday.   tiZANidine  (ZANAFLEX ) 2 MG tablet Take 2 mg by mouth 3 (three) times daily as needed for muscle spasms.   EPINEPHrine 0.3 mg/0.3 mL IJ SOAJ injection Inject 0.3 mg into the muscle as needed for anaphylaxis.   acetaminophen  (TYLENOL ) 500 MG tablet Take 1,000 mg by mouth every 6 (six) hours as needed for headache.   lisinopril -hydrochlorothiazide  (ZESTORETIC ) 20-12.5 MG tablet Take 1 tablet by mouth daily.   valACYclovir  (VALTREX ) 1000 MG tablet Take 1 tablet (1,000 mg total) by mouth 3 (three) times daily.   hydrOXYzine  (ATARAX ) 10 MG tablet Take 1 tablet (10 mg total) by mouth 3 (three) times daily as needed.    FLUoxetine  (PROZAC ) 40 MG capsule Take 2 capsules (80 mg total) by mouth daily.   traZODone  (DESYREL ) 50 MG tablet Take 1 tablet (50 mg total) by mouth at bedtime as needed for sleep.      Medical Decision Making  Recommend inpatient psychiatric admission for stabilization and treatment. Pt is endorsing overwhelming anxiety and depressive symptoms. He lives alone, feels unsafe with himself and unable to contract for safety due to his stressors.  Patient is established with an outpatient psychiatric provider, but feels he will benefit more from inpatient psychiatric hospitalization at this time.  Patient will be admitted to the continuous observation unit for safety monitoring, pending transfer to an inpatient psychiatric unit. LCSW will seek bed placement.   Lab Orders         CBC with Differential/Platelet         Comprehensive metabolic panel         Hemoglobin A1c         Lipid panel         TSH         Glucose, capillary         POCT Urine Drug Screen - (I-Screen)     EKG  Recommend SSI-Type 2 diabetes  Home meds -Jardiance  10 mg PO daily for Type 2 diabetes  New  medication started -Melatonin 3 mg PO at bedtime prn insomnia -Norvasc  5 mg PO daily for HTN -Gabapentin  100 mg PO daily at bedtime for diabetic neuropathy  Other Prns -Tylenol , Maalox, MOM, Atarax  -agitation Protocol medications   Recommendations  Based on my evaluation the patient does not appear to have an emergency medical condition.  Recommend inpatient psychiatric admission for stabilization and treatment.   Thurman LULLA Ivans, NP 08/15/23  1:37 AM

## 2023-08-15 ENCOUNTER — Other Ambulatory Visit: Payer: Self-pay

## 2023-08-15 ENCOUNTER — Inpatient Hospital Stay (HOSPITAL_COMMUNITY)
Admission: AD | Admit: 2023-08-15 | Discharge: 2023-08-21 | DRG: 885 | Disposition: A | Discharge status: Home / Self Care | Source: Intra-hospital | Attending: Psychiatry | Admitting: Psychiatry

## 2023-08-15 ENCOUNTER — Encounter (HOSPITAL_COMMUNITY): Payer: Self-pay | Admitting: Nurse Practitioner

## 2023-08-15 DIAGNOSIS — Z5986 Financial insecurity: Secondary | ICD-10-CM | POA: Diagnosis not present

## 2023-08-15 DIAGNOSIS — Z79899 Other long term (current) drug therapy: Secondary | ICD-10-CM | POA: Diagnosis not present

## 2023-08-15 DIAGNOSIS — Z888 Allergy status to other drugs, medicaments and biological substances status: Secondary | ICD-10-CM

## 2023-08-15 DIAGNOSIS — Z91030 Bee allergy status: Secondary | ICD-10-CM

## 2023-08-15 DIAGNOSIS — F332 Major depressive disorder, recurrent severe without psychotic features: Secondary | ICD-10-CM | POA: Diagnosis present

## 2023-08-15 DIAGNOSIS — Z7984 Long term (current) use of oral hypoglycemic drugs: Secondary | ICD-10-CM | POA: Diagnosis not present

## 2023-08-15 DIAGNOSIS — Z818 Family history of other mental and behavioral disorders: Secondary | ICD-10-CM | POA: Diagnosis not present

## 2023-08-15 DIAGNOSIS — Z7985 Long-term (current) use of injectable non-insulin antidiabetic drugs: Secondary | ICD-10-CM

## 2023-08-15 DIAGNOSIS — F4321 Adjustment disorder with depressed mood: Secondary | ICD-10-CM | POA: Diagnosis present

## 2023-08-15 DIAGNOSIS — F419 Anxiety disorder, unspecified: Secondary | ICD-10-CM | POA: Diagnosis present

## 2023-08-15 DIAGNOSIS — Z5941 Food insecurity: Secondary | ICD-10-CM | POA: Diagnosis not present

## 2023-08-15 DIAGNOSIS — Z5982 Transportation insecurity: Secondary | ICD-10-CM | POA: Diagnosis not present

## 2023-08-15 DIAGNOSIS — Z560 Unemployment, unspecified: Secondary | ICD-10-CM

## 2023-08-15 DIAGNOSIS — I10 Essential (primary) hypertension: Secondary | ICD-10-CM | POA: Diagnosis present

## 2023-08-15 DIAGNOSIS — F4310 Post-traumatic stress disorder, unspecified: Secondary | ICD-10-CM | POA: Diagnosis present

## 2023-08-15 DIAGNOSIS — Z634 Disappearance and death of family member: Secondary | ICD-10-CM

## 2023-08-15 LAB — COMPREHENSIVE METABOLIC PANEL WITH GFR
ALT: 41 U/L (ref 0–44)
AST: 35 U/L (ref 15–41)
Albumin: 4 g/dL (ref 3.5–5.0)
Alkaline Phosphatase: 66 U/L (ref 38–126)
Anion gap: 11 (ref 5–15)
BUN: 9 mg/dL (ref 6–20)
CO2: 26 mmol/L (ref 22–32)
Calcium: 9.7 mg/dL (ref 8.9–10.3)
Chloride: 102 mmol/L (ref 98–111)
Creatinine, Ser: 1.19 mg/dL (ref 0.61–1.24)
GFR, Estimated: >60 mL/min (ref >60)
Glucose, Bld: 148 mg/dL — ABNORMAL HIGH (ref 70–99)
Potassium: 3.4 mmol/L — ABNORMAL LOW (ref 3.5–5.1)
Sodium: 139 mmol/L (ref 135–145)
Total Bilirubin: 0.7 mg/dL (ref 0.0–1.2)
Total Protein: 7.1 g/dL (ref 6.5–8.1)

## 2023-08-15 LAB — POCT URINE DRUG SCREEN - MANUAL ENTRY (I-SCREEN)
POC Amphetamine UR: NOT DETECTED
POC Buprenorphine (BUP): NOT DETECTED
POC Cocaine UR: NOT DETECTED
POC Marijuana UR: NOT DETECTED
POC Methadone UR: NOT DETECTED
POC Methamphetamine UR: NOT DETECTED
POC Morphine: NOT DETECTED
POC Oxazepam (BZO): NOT DETECTED
POC Oxycodone UR: NOT DETECTED
POC Secobarbital (BAR): NOT DETECTED

## 2023-08-15 LAB — LIPID PANEL
Cholesterol: 142 mg/dL (ref 0–200)
HDL: 65 mg/dL (ref >40)
LDL Cholesterol: 68 mg/dL (ref 0–99)
Total CHOL/HDL Ratio: 2.2 ratio
Triglycerides: 44 mg/dL (ref <150)
VLDL: 9 mg/dL (ref 0–40)

## 2023-08-15 LAB — CBC WITH DIFFERENTIAL/PLATELET
Abs Immature Granulocytes: 0.04 K/uL (ref 0.00–0.07)
Basophils Absolute: 0 K/uL (ref 0.0–0.1)
Basophils Relative: 1 %
Eosinophils Absolute: 0.1 K/uL (ref 0.0–0.5)
Eosinophils Relative: 1 %
HCT: 44.9 % (ref 39.0–52.0)
Hemoglobin: 15.4 g/dL (ref 13.0–17.0)
Immature Granulocytes: 1 %
Lymphocytes Relative: 22 %
Lymphs Abs: 1.9 K/uL (ref 0.7–4.0)
MCH: 30.5 pg (ref 26.0–34.0)
MCHC: 34.3 g/dL (ref 30.0–36.0)
MCV: 88.9 fL (ref 80.0–100.0)
Monocytes Absolute: 0.4 K/uL (ref 0.1–1.0)
Monocytes Relative: 5 %
Neutro Abs: 5.9 K/uL (ref 1.7–7.7)
Neutrophils Relative %: 70 %
Platelets: 333 K/uL (ref 150–400)
RBC: 5.05 MIL/uL (ref 4.22–5.81)
RDW: 11.7 % (ref 11.5–15.5)
WBC: 8.3 K/uL (ref 4.0–10.5)
nRBC: 0 % (ref 0.0–0.2)

## 2023-08-15 LAB — HEMOGLOBIN A1C
Hgb A1c MFr Bld: 6.4 % — ABNORMAL HIGH (ref 4.8–5.6)
Mean Plasma Glucose: 136.98 mg/dL

## 2023-08-15 LAB — TSH: TSH: 1.665 u[IU]/mL (ref 0.350–4.500)

## 2023-08-15 LAB — GLUCOSE, CAPILLARY
Glucose-Capillary: 151 mg/dL — ABNORMAL HIGH (ref 70–99)
Glucose-Capillary: 116 mg/dL — ABNORMAL HIGH (ref 70–99)
Glucose-Capillary: 150 mg/dL — ABNORMAL HIGH (ref 70–99)
Glucose-Capillary: 252 mg/dL — ABNORMAL HIGH (ref 70–99)

## 2023-08-15 MED ORDER — HYDROXYZINE HCL 10 MG PO TABS: 10.0000 mg | ORAL_TABLET | Freq: Three times a day (TID) | ORAL | Status: DC | PRN

## 2023-08-15 MED ORDER — GABAPENTIN 100 MG PO CAPS
100.0000 mg | ORAL_CAPSULE | Freq: Every day | ORAL | Status: DC
  Administered 2023-08-15 – 2023-08-20 (×6): 100 mg via ORAL
  Filled 2023-08-15 (×6): qty 1

## 2023-08-15 MED ORDER — OLANZAPINE 5 MG PO TBDP: 5.0000 mg | ORAL_TABLET | Freq: Three times a day (TID) | ORAL | Status: DC | PRN

## 2023-08-15 MED ORDER — FLUOXETINE HCL 20 MG PO CAPS
80.0000 mg | ORAL_CAPSULE | Freq: Every day | ORAL | Status: DC
  Administered 2023-08-15: 80 mg via ORAL
  Filled 2023-08-15 (×2): qty 4

## 2023-08-15 MED ORDER — LISINOPRIL 20 MG PO TABS
20.0000 mg | ORAL_TABLET | Freq: Every day | ORAL | Status: DC
  Administered 2023-08-16 – 2023-08-21 (×6): 20 mg via ORAL
  Filled 2023-08-15 (×6): qty 1

## 2023-08-15 MED ORDER — TRAZODONE HCL 50 MG PO TABS: 50.0000 mg | ORAL_TABLET | Freq: Every evening | ORAL | Status: DC | PRN

## 2023-08-15 MED ORDER — POTASSIUM CHLORIDE CRYS ER 20 MEQ PO TBCR
40.0000 meq | EXTENDED_RELEASE_TABLET | Freq: Once | ORAL | Status: AC
  Administered 2023-08-15: 40 meq via ORAL
  Filled 2023-08-15: qty 2

## 2023-08-15 MED ORDER — TRAZODONE HCL 50 MG PO TABS
50.0000 mg | ORAL_TABLET | Freq: Every evening | ORAL | Status: DC | PRN
  Administered 2023-08-15 – 2023-08-20 (×4): 50 mg via ORAL
  Filled 2023-08-15 (×4): qty 1

## 2023-08-15 MED ORDER — ATORVASTATIN CALCIUM 10 MG PO TABS
10.0000 mg | ORAL_TABLET | Freq: Every day | ORAL | Status: DC
Start: 2023-08-15 — End: 2023-08-15
  Administered 2023-08-15: 10 mg via ORAL
  Filled 2023-08-15: qty 1

## 2023-08-15 MED ORDER — OLANZAPINE 10 MG IM SOLR: 5.0000 mg | Freq: Three times a day (TID) | INTRAMUSCULAR | Status: DC | PRN

## 2023-08-15 MED ORDER — AMLODIPINE BESYLATE 5 MG PO TABS
5.0000 mg | ORAL_TABLET | Freq: Every day | ORAL | Status: DC
  Administered 2023-08-15: 5 mg via ORAL
  Filled 2023-08-15 (×2): qty 1

## 2023-08-15 MED ORDER — HYDROXYZINE HCL 10 MG PO TABS
10.0000 mg | ORAL_TABLET | Freq: Three times a day (TID) | ORAL | Status: DC | PRN
  Administered 2023-08-15 – 2023-08-20 (×4): 10 mg via ORAL
  Filled 2023-08-15 (×4): qty 1

## 2023-08-15 MED ORDER — ATORVASTATIN CALCIUM 10 MG PO TABS
10.0000 mg | ORAL_TABLET | Freq: Every day | ORAL | Status: DC
  Administered 2023-08-16 – 2023-08-21 (×6): 10 mg via ORAL
  Filled 2023-08-15 (×6): qty 1

## 2023-08-15 MED ORDER — HYDROCHLOROTHIAZIDE 12.5 MG PO TABS
12.5000 mg | ORAL_TABLET | Freq: Every day | ORAL | Status: DC
  Administered 2023-08-16 – 2023-08-21 (×6): 12.5 mg via ORAL
  Filled 2023-08-15 (×6): qty 1

## 2023-08-15 MED ORDER — ACETAMINOPHEN 500 MG PO TABS: 1000.0000 mg | ORAL_TABLET | Freq: Four times a day (QID) | ORAL | Status: DC | PRN

## 2023-08-15 MED ORDER — INSULIN ASPART 100 UNIT/ML IJ SOLN
0.0000 [IU] | Freq: Every day | INTRAMUSCULAR | Status: DC
  Administered 2023-08-15: 3 [IU] via SUBCUTANEOUS
  Administered 2023-08-18: 2 [IU] via SUBCUTANEOUS

## 2023-08-15 MED ORDER — EMPAGLIFLOZIN 10 MG PO TABS
10.0000 mg | ORAL_TABLET | Freq: Every day | ORAL | Status: DC
  Administered 2023-08-16 – 2023-08-21 (×6): 10 mg via ORAL
  Filled 2023-08-15 (×6): qty 1

## 2023-08-15 MED ORDER — FLUOXETINE HCL 20 MG PO CAPS
80.0000 mg | ORAL_CAPSULE | Freq: Every day | ORAL | Status: DC
  Administered 2023-08-16 – 2023-08-21 (×6): 80 mg via ORAL
  Filled 2023-08-15 (×6): qty 4

## 2023-08-15 MED ORDER — LISINOPRIL-HYDROCHLOROTHIAZIDE 20-12.5 MG PO TABS: 1.0000 | ORAL_TABLET | Freq: Every day | ORAL | Status: DC

## 2023-08-15 MED ORDER — INSULIN ASPART 100 UNIT/ML IJ SOLN
0.0000 [IU] | Freq: Three times a day (TID) | INTRAMUSCULAR | Status: DC
  Administered 2023-08-16 – 2023-08-17 (×4): 3 [IU] via SUBCUTANEOUS
  Administered 2023-08-17: 2 [IU] via SUBCUTANEOUS
  Administered 2023-08-18 – 2023-08-20 (×6): 3 [IU] via SUBCUTANEOUS
  Administered 2023-08-20: 2 [IU] via SUBCUTANEOUS
  Administered 2023-08-20 – 2023-08-21 (×2): 3 [IU] via SUBCUTANEOUS

## 2023-08-15 NOTE — Progress Notes (Signed)
   08/15/23 1700  Psych Admission Type (Psych Patients Only)  Admission Status Voluntary  Psychosocial Assessment  Patient Complaints Depression;Loneliness  Eye Contact Brief  Facial Expression Anxious  Affect Appropriate to circumstance  Speech Logical/coherent  Interaction Assertive  Motor Activity Slow  Appearance/Hygiene In scrubs  Behavior Characteristics Cooperative  Mood Pleasant  Thought Process  Coherency WDL  Content WDL  Delusions None reported or observed  Perception WDL  Hallucination None reported or observed  Judgment Poor  Confusion None  Danger to Self  Current suicidal ideation? Denies  Danger to Others  Danger to Others None reported or observed

## 2023-08-15 NOTE — ED Provider Notes (Signed)
 FBC/OBS ASAP Discharge Summary  Date and Time: 08/15/2023 2:01 PM  Name: Lance Chapman  MRN:  [id number]   Discharge Diagnoses:  Final diagnoses:  Severe episode of recurrent major depressive disorder, without psychotic features (HCC)  GAD (generalized anxiety disorder)  Grief reaction with prolonged bereavement  Adjustment disorder with mixed anxiety and depressed mood  Stress   HPI: Lance Chapman is a 58 y.o. male with history of MDD, GAD, grief, hypertension, uncontrolled diabetes, hospitalization in 2022 for OD on sleep meds who presents with worsening depression and anxiety secondary to grief from death of his mother.  Patient was recommended for inpatient overnight and has been accepted to [facility] pending bed availability.  Subjective:  Talked about mother passing away 1 year ago and prolonged grief around this.  Patient reports that he is doing okay this morning.  Reports that he understands plan to be that he is being recommended for inpatient.  Discussed with patient that bed is not available but we will transfer once available.  He endorses pervasive sadness around death of his mother.  Reports that he slept okay last night.  Says that he has a grief counselor who was notified that he is in the hospital by his brother.  Discussed that I informed his primary psychiatrist Dr. [name] about hospitalization.  Stay Summary: Patient slept overnight.  Patient was depressed appearing but had full range of emotions.  Total Time spent with patient: 45 minutes  Past Psychiatric History: MDD, GAD, grief; fluoxetine  80 mg once daily, trazodone  50 as needed, hydroxyzine  10 as needed; sees grief counselor; hospitalization in 2022 for SI Past Medical History: Hypertension, uncontrolled diabetes, chronic pain Family History: Late onset dementia in mother Family Psychiatric History: Depression in mother Social History: Did not discuss, denies substance use Tobacco Cessation:  N/A, patient does not  currently use tobacco products  Current Medications:  Current Facility-Administered Medications  Medication Dose Route Frequency Provider Last Rate Last Admin   acetaminophen  (TYLENOL ) tablet 650 mg  650 mg Oral Q6H PRN Onuoha, Chinwendu V, NP       alum & mag hydroxide-simeth (MAALOX/MYLANTA) 200-200-20 MG/5ML suspension 30 mL  30 mL Oral Q4H PRN Onuoha, Chinwendu V, NP       amLODipine  (NORVASC ) tablet 5 mg  5 mg Oral Daily Onuoha, Chinwendu V, NP   5 mg at 08/15/23 0048   atorvastatin  (LIPITOR) tablet 10 mg  10 mg Oral Daily Gertie Broerman, MD       empagliflozin  (JARDIANCE ) tablet 10 mg  10 mg Oral Daily Onuoha, Chinwendu V, NP   10 mg at 08/15/23 9062   FLUoxetine  (PROZAC ) capsule 80 mg  80 mg Oral Daily Pallas Wahlert, MD       gabapentin  (NEURONTIN ) capsule 100 mg  100 mg Oral QHS Onuoha, Chinwendu V, NP   100 mg at 08/15/23 0048   hydrOXYzine  (ATARAX ) tablet 10 mg  10 mg Oral TID PRN Kealie Barrie, MD       insulin  aspart (novoLOG ) injection 0-15 Units  0-15 Units Subcutaneous TID WC Onuoha, Chinwendu V, NP       insulin  aspart (novoLOG ) injection 0-5 Units  0-5 Units Subcutaneous QHS Onuoha, Chinwendu V, NP       magnesium  hydroxide (MILK OF MAGNESIA) suspension 30 mL  30 mL Oral Daily PRN Onuoha, Chinwendu V, NP       melatonin tablet 3 mg  3 mg Oral QHS PRN Onuoha, Chinwendu V, NP   3 mg at  08/15/23 0048   OLANZapine  (ZYPREXA ) injection 5 mg  5 mg Intramuscular TID PRN Onuoha, Chinwendu V, NP       OLANZapine  zydis (ZYPREXA ) disintegrating tablet 5 mg  5 mg Oral TID PRN Onuoha, Chinwendu V, NP       traZODone  (DESYREL ) tablet 50 mg  50 mg Oral QHS PRN Severo Beber, MD       Current Outpatient Medications  Medication Sig Dispense Refill   acetaminophen  (TYLENOL ) 500 MG tablet Take 1,000 mg by mouth every 6 (six) hours as needed for headache, mild pain (pain score 1-3) or moderate pain (pain score 4-6).     atorvastatin  (LIPITOR) 10 MG tablet Take 10 mg by mouth daily.      FLUoxetine  (PROZAC ) 40 MG capsule Take 2 capsules (80 mg total) by mouth daily. 180 capsule 0   hydrOXYzine  (ATARAX ) 10 MG tablet Take 1 tablet (10 mg total) by mouth 3 (three) times daily as needed. 270 tablet 0   JARDIANCE  10 MG TABS tablet Take 10 mg by mouth daily.     lisinopril -hydrochlorothiazide  (ZESTORETIC ) 20-12.5 MG tablet Take 1 tablet by mouth daily.     OZEMPIC, 0.25 OR 0.5 MG/DOSE, 2 MG/3ML SOPN Inject 2.5 mLs into the skin once a week. On Fridays     TRESIBA  FLEXTOUCH 100 UNIT/ML FlexTouch Pen Inject 20 Units into the skin in the morning.     EPINEPHrine 0.3 mg/0.3 mL IJ SOAJ injection Inject 0.3 mg into the muscle as needed for anaphylaxis.     traZODone  (DESYREL ) 50 MG tablet Take 1 tablet (50 mg total) by mouth at bedtime as needed for sleep. 90 tablet 0   valACYclovir  (VALTREX ) 1000 MG tablet Take 1 tablet (1,000 mg total) by mouth 3 (three) times daily. (Patient taking differently: Take 1,000 mg by mouth 3 (three) times daily as needed (breakout).) 21 tablet 0    PTA Medications:  Facility Ordered Medications  Medication   acetaminophen  (TYLENOL ) tablet 650 mg   alum & mag hydroxide-simeth (MAALOX/MYLANTA) 200-200-20 MG/5ML suspension 30 mL   magnesium  hydroxide (MILK OF MAGNESIA) suspension 30 mL   melatonin tablet 3 mg   empagliflozin  (JARDIANCE ) tablet 10 mg   gabapentin  (NEURONTIN ) capsule 100 mg   insulin  aspart (novoLOG ) injection 0-15 Units   insulin  aspart (novoLOG ) injection 0-5 Units   amLODipine  (NORVASC ) tablet 5 mg   OLANZapine  zydis (ZYPREXA ) disintegrating tablet 5 mg   OLANZapine  (ZYPREXA ) injection 5 mg   [COMPLETED] potassium chloride  SA (KLOR-CON  M) CR tablet 40 mEq   FLUoxetine  (PROZAC ) capsule 80 mg   hydrOXYzine  (ATARAX ) tablet 10 mg   atorvastatin  (LIPITOR) tablet 10 mg   traZODone  (DESYREL ) tablet 50 mg   PTA Medications  Medication Sig   TRESIBA  FLEXTOUCH 100 UNIT/ML FlexTouch Pen Inject 20 Units into the skin in the morning.   JARDIANCE   10 MG TABS tablet Take 10 mg by mouth daily.   acetaminophen  (TYLENOL ) 500 MG tablet Take 1,000 mg by mouth every 6 (six) hours as needed for headache, mild pain (pain score 1-3) or moderate pain (pain score 4-6).   lisinopril -hydrochlorothiazide  (ZESTORETIC ) 20-12.5 MG tablet Take 1 tablet by mouth daily.   hydrOXYzine  (ATARAX ) 10 MG tablet Take 1 tablet (10 mg total) by mouth 3 (three) times daily as needed.   FLUoxetine  (PROZAC ) 40 MG capsule Take 2 capsules (80 mg total) by mouth daily.   atorvastatin  (LIPITOR) 10 MG tablet Take 10 mg by mouth daily.   OZEMPIC, 0.25 OR 0.5 MG/DOSE,  2 MG/3ML SOPN Inject 2.5 mLs into the skin once a week. On Fridays   EPINEPHrine 0.3 mg/0.3 mL IJ SOAJ injection Inject 0.3 mg into the muscle as needed for anaphylaxis.   valACYclovir  (VALTREX ) 1000 MG tablet Take 1 tablet (1,000 mg total) by mouth 3 (three) times daily. (Patient taking differently: Take 1,000 mg by mouth 3 (three) times daily as needed (breakout).)   traZODone  (DESYREL ) 50 MG tablet Take 1 tablet (50 mg total) by mouth at bedtime as needed for sleep.       10/19/2021    3:55 PM 07/19/2021    1:27 PM 07/10/2021   11:59 AM  Depression screen PHQ 2/9  Decreased Interest 3 3 1   Down, Depressed, Hopeless 2 2 1   PHQ - 2 Score 5 5 2   Altered sleeping 3 2 2   Tired, decreased energy 0 0 1  Change in appetite 2 2 1   Feeling bad or failure about yourself  1 0 2  Trouble concentrating 2 0 2  Moving slowly or fidgety/restless 2 2 1   Suicidal thoughts 0 0 0  PHQ-9 Score 15 11 11   Difficult doing work/chores Somewhat difficult Not difficult at all Somewhat difficult    Flowsheet Row ED from 08/14/2023 in [facility] ED from 08/13/2021 in [facility] Emergency Department at [facility] ED from 04/25/2021 in [facility] Emergency Department at [facility]  C-SSRS RISK CATEGORY No Risk No Risk No Risk    Musculoskeletal  Strength & Muscle Tone: within normal  limits Gait & Station: normal Patient leans: N/A  Psychiatric Specialty Exam  Presentation  General Appearance:  Appropriate for Environment; Fairly Groomed  Eye Contact: Fair  Speech: Clear and Coherent  Speech Volume: Decreased  Handedness: Right   Mood and Affect  Mood: Depressed; Anxious  Affect: Congruent   Thought Process  Thought Processes: Coherent  Descriptions of Associations:Intact  Orientation:Full (Time, Place and Person)  Thought Content:WDL  Diagnosis of Schizophrenia or Schizoaffective disorder in past: No    Hallucinations:Hallucinations: None  Ideas of Reference:None  Suicidal Thoughts:Suicidal Thoughts: No  Homicidal Thoughts:Homicidal Thoughts: No   Sensorium  Memory: Immediate Fair  Judgment: Intact  Insight: Present   Executive Functions  Concentration: Good  Attention Span: Good  Recall: Fair  Fund of Knowledge: Fair  Language: Good   Psychomotor Activity  Psychomotor Activity: Psychomotor Activity: Normal   Assets  Assets: Communication Skills; Desire for Improvement   Sleep  Sleep: Sleep: Poor  Safety Checks Durations: No data on file for Sleeping  Nutritional Assessment (For OBS and FBC admissions only) Has the patient had a weight loss or gain of 10 pounds or more in the last 3 months?: No Has the patient had a decrease in food intake/or appetite?: No Does the patient have dental problems?: No Does the patient have eating habits or behaviors that may be indicators of an eating disorder including binging or inducing vomiting?: No Has the patient recently lost weight without trying?: 0 Has the patient been eating poorly because of a decreased appetite?: 0 Malnutrition Screening Tool Score: 0    Physical Exam  Physical Exam Vitals and nursing note reviewed.  Constitutional:      General: He is not in acute distress.    Appearance: He is well-developed.  HENT:     Head:  Normocephalic and atraumatic.  Pulmonary:     Effort: Pulmonary effort is normal. No respiratory distress.  Musculoskeletal:  General: No swelling.  Neurological:     General: No focal deficit present.     Mental Status: He is alert.    Review of Systems  Constitutional:  Negative for fever.  Cardiovascular:  Negative for chest pain and palpitations.  Gastrointestinal:  Negative for constipation, diarrhea, nausea and vomiting.  Musculoskeletal:  Positive for back pain.  Neurological:  Negative for dizziness, weakness and headaches.   Blood pressure 138/82, pulse 87, temperature 98.3 F (36.8 C), temperature source Oral, resp. rate 16, SpO2 100%. There is no height or weight on file to calculate BMI.  Demographic Factors:  Male  Loss Factors: Loss of significant relationship  Historical Factors: NA  Risk Reduction Factors:   Positive social support  Continued Clinical Symptoms:  Depression:   Anhedonia Severe  Cognitive Features That Contribute To Risk:  Loss of executive function    Suicide Risk:  Moderate: Intermittent suicidal thoughts that are more passive in nature.  However history of suicidal thoughts requiring hospitalization and appears to be living alone.  Does not have access to firearms.  Dysphoric affect.  Close outpatient supportive.  Plan Of Care/Follow-up recommendations:  Recommend transfer to inpatient psychiatry given patient's decreased functioning, although does not appear to be having suicidal thoughts.  Continued home regimen without adjustments.  Informed patient's primary psychiatrist.  DispositionBETHA Pack [facility] voluntary  Justino Cornish, MD PGY-2 Psychiatry Resident 08/15/2023, 2:01 PM

## 2023-08-15 NOTE — Group Note (Signed)
 Date:  08/16/2023 Time:  6:36 AM  Group Topic/Focus:  Wrap-Up Group:   The focus of this group is to help patients review their daily goal of treatment and discuss progress on daily workbooks.    Participation Level:  Active  Participation Quality:  Appropriate and Attentive  Affect:  Appropriate  Cognitive:  Alert and Appropriate  Insight: Appropriate and Good  Engagement in Group:  Engaged  Modes of Intervention:  Discussion and Education  Additional Comments:  Pt attended and participated in wrap up group this evening. Pt stated that  while they are here they can benefit from resources about grief counseling. Pt is grateful to have come to the hospital and shares that they have been isolative since their mother's passing. Pt has no concerns to relay at this time.   Lance Chapman 08/16/2023, 6:36 AM

## 2023-08-15 NOTE — ED Notes (Signed)
 Pt observed/assessed in recliner sleeping. RR even and unlabored, appearing in no noted distress. Environmental check complete, will continue to monitor for safety

## 2023-08-15 NOTE — ED Notes (Signed)
 Pt A&O x 4, presents with depression and anxiety.  Pt also having Panic attacks.  Passing of mother is his major stressor and loneliness.  Denies SI, HI or AVH.  Comfort measures given.  Monitoring for safety.

## 2023-08-15 NOTE — Plan of Care (Signed)

## 2023-08-15 NOTE — Group Note (Signed)
 Occupational Therapy Group Note  Group Topic: Sleep Hygiene  Group Date: 08/15/2023 Start Time: 1500 End Time: 1530 Facilitators: Dot Dallas MATSU, OT   Group Description: Group encouraged increased participation and engagement through topic focused on sleep hygiene. Patients reflected on the quality of sleep they typically receive and identified areas that need improvement. Group was given background information on sleep and sleep hygiene, including common sleep disorders. Group members also received information on how to improve one's sleep and introduced a sleep diary as a tool that can be utilized to track sleep quality over a length of time. Group session ended with patients identifying one or more strategies they could utilize or implement into their sleep routine in order to improve overall sleep quality.        Therapeutic Goal(s):  Identify one or more strategies to improve overall sleep hygiene  Identify one or more areas of sleep that are negatively impacted (sleep too much, too little, etc)     Participation Level: Engaged   Participation Quality: Independent   Behavior: Appropriate   Speech/Thought Process: Relevant   Affect/Mood: Appropriate   Insight: Fair   Judgement: Fair      Modes of Intervention: Education  Patient Response to Interventions:  Attentive   Plan: Continue to engage patient in OT groups 2 - 3x/week.  08/15/2023  [place] MATSU Dot, OT   Connell Bognar, OT

## 2023-08-15 NOTE — ED Notes (Signed)
 Stable.  Vol Transferring to [facility]  via  safe transport  all belongings and valuable sent with with patient and safe transport  Denied current SI plan and intent.  Denied HI and A/V hallucinations
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
## 2023-08-15 NOTE — Discharge Instructions (Addendum)
 Follow-up recommendations:  Activity:  Normal, as tolerated Diet:  Per PCP recommendation  Patient is instructed prior to discharge to:  Take all medications as prescribed by mental healthcare provider. Report any adverse effects and/or reactions from the medicines to outpatient provider promptly. To not engage in substance use while on psychiatric medicines.  In the event of worsening symptoms, patient is instructed to call the crisis hotline at 988, 911, or go to the nearest ED for appropriate evaluation and treatment of symptoms. To follow-up with primary care provider for your other medical issues, concerns and, or healthcare needs.

## 2023-08-15 NOTE — ED Notes (Signed)
 Pt observed lying in bed. Eyes closed respirations even and non labored. NAD Hourly observations continue for safety.

## 2023-08-15 NOTE — Tx Team (Signed)
 Initial Treatment Plan 08/15/2023 5:42 PM Lance Chapman [id number]    PATIENT STRESSORS: Health problems   Medication change or noncompliance     PATIENT STRENGTHS: Capable of independent living  Motivation for treatment/growth    PATIENT IDENTIFIED PROBLEMS: Depression  Health decline                   DISCHARGE CRITERIA:  Improved stabilization in mood, thinking, and/or behavior Verbal commitment to aftercare and medication compliance  PRELIMINARY DISCHARGE PLAN: Attend aftercare/continuing care group Return to previous living arrangement  PATIENT/FAMILY INVOLVEMENT: This treatment plan has been presented to and reviewed with the patient, [name]. The patient has been given the opportunity to ask questions and make suggestions.  Huel Mall, RN 08/15/2023, 5:42 PM

## 2023-08-15 NOTE — Progress Notes (Signed)
 Patient admitted to unit, A&O x4, answered patient questions. Patient denies SI,HI and AVH a this time. Patient states that he came in because he needed help with his depression, which he rates 6/10 and anxiety 7/10. Patient states that he is still grieving from the last of his mother last year, after being her sole caregiver. Patient oriented to unit, reviewed unit schedule and rules, pt verbalized understanding. Patient is having dinner, calm, cooperative, no acute distress noted. Safety checks in place, patient safe in unit.

## 2023-08-15 NOTE — Plan of Care (Signed)
   Problem: Education: Goal: Knowledge of [facility] Education information/materials will improve Outcome: Progressing Goal: Emotional status will improve Outcome: Progressing Goal: Mental status will improve Outcome: Progressing Goal: Verbalization of understanding the information provided will improve Outcome: Progressing

## 2023-08-16 ENCOUNTER — Encounter (HOSPITAL_COMMUNITY): Payer: Self-pay

## 2023-08-16 DIAGNOSIS — F332 Major depressive disorder, recurrent severe without psychotic features: Secondary | ICD-10-CM | POA: Diagnosis not present

## 2023-08-16 LAB — GLUCOSE, CAPILLARY
Glucose-Capillary: 118 mg/dL — ABNORMAL HIGH (ref 70–99)
Glucose-Capillary: 159 mg/dL — ABNORMAL HIGH (ref 70–99)
Glucose-Capillary: 154 mg/dL — ABNORMAL HIGH (ref 70–99)
Glucose-Capillary: 174 mg/dL — ABNORMAL HIGH (ref 70–99)

## 2023-08-16 MED ORDER — MIRTAZAPINE 15 MG PO TABS
15.0000 mg | ORAL_TABLET | Freq: Every day | ORAL | Status: DC
  Administered 2023-08-16 – 2023-08-20 (×5): 15 mg via ORAL
  Filled 2023-08-16 (×5): qty 1

## 2023-08-16 NOTE — Progress Notes (Signed)
(  Sleep Hours) -7.0 (Any PRNs that were needed, meds refused, or side effects to meds)- prn hydroxyzine  10mg  and trazodone  50mg s @ 2156 (Any disturbances and when (visitation, over night)-none (Concerns raised by the patient)- none (SI/HI/AVH)- Denies all

## 2023-08-16 NOTE — BHH Suicide Risk Assessment (Signed)
 [facility] Admission Suicide Risk Assessment   Nursing information obtained from:  Patient Demographic factors:  Male Current Mental Status:  NA Loss Factors:  Decline in physical health Historical Factors:  NA Risk Reduction Factors:  Positive social support  Total Time spent with patient: 45 minutes Principal Problem: MDD (major depressive disorder), recurrent severe, without psychosis (HCC) Diagnosis:  Principal Problem:   MDD (major depressive disorder), recurrent severe, without psychosis (HCC)  Subjective Data:  58 year old African-American male, single, unemployed, on SSI disability, lives alone.  Background history of MDD recurrent, grief PTSD and multiple medical comorbidity presented voluntarily to urgent care on account of worsening depression.  Patient has been grieving the loss of his mother who passed in July last year.  His father's anniversary is this months.   Continued Clinical Symptoms:  Alcohol Use Disorder Identification Test Final Score (AUDIT): 0 The Alcohol Use Disorders Identification Test, Guidelines for Use in Primary Care, Second Edition.  World Science writer [facility]). Score between 0-7:  no or low risk or alcohol related problems. Score between 8-15:  moderate risk of alcohol related problems. Score between 16-19:  high risk of alcohol related problems. Score 20 or above:  warrants further diagnostic evaluation for alcohol dependence and treatment.   CLINICAL FACTORS:   Depression:   Severe  Musculoskeletal: Strength & Muscle Tone: within normal limits Gait & Station: normal Patient leans: N/A     Psychiatric Specialty Exam:   Presentation  General Appearance:  Casually dressed, not in any distress, good relatedness.   Eye Contact: Good.   Speech: Spontaneous.  Soft spoken.   Mood and Affect  Mood: Depressed.   Affect: Restricted and mood-congruent.   Thought Process  Thought Processes: Linear and goal directed.   Descriptions of  Associations:Intact   Orientation:Full (Time, Place and Person)   Thought Content: Negative rumination about his losses.  No guilty rumination.  No current suicidal thoughts.  No homicidal thoughts.  No thoughts of violence.   No delusional theme.  No obsessions.   Hallucinations: No hallucination in any modality.   Sensorium  Memory: Good.   Judgment: Good.   Insight: Good   Executive Functions  Concentration: Good.   Attention Span: Good.   Recall: Good.   Fund of Knowledge: Good.   Language: Good     Psychomotor Activity  Slightly decreased psychomotor activity  Physical Exam: Physical Exam ROS Blood pressure 130/85, pulse 92, temperature 98.2 F (36.8 C), temperature source Oral, resp. rate 20, height 5' 9 (1.753 m), weight 79.4 kg, SpO2 99%. Body mass index is 25.84 kg/m.   COGNITIVE FEATURES THAT CONTRIBUTE TO RISK:  None    SUICIDE RISK:   Mild:  Suicidal ideation of limited frequency, intensity, duration, and specificity.  There are no identifiable plans, no associated intent, mild dysphoria and related symptoms, good self-control (both objective and subjective assessment), few other risk factors, and identifiable protective factors, including available and accessible social support.  PLAN OF CARE:  1.  Every 15 minute check 2.  Augmentation of fluoxetine  with mirtazapine .  I certify that inpatient services furnished can reasonably be expected to improve the patient's condition.   Lance DELENA Forehand, MD 08/16/2023, 2:49 PM

## 2023-08-16 NOTE — BHH Group Notes (Signed)
 Adult Psychoeducational Group Note  Date:  08/16/2023 Time:  8:25 PM  Group Topic/Focus:  Wrap-Up Group:   The focus of this group is to help patients review their daily goal of treatment and discuss progress on daily workbooks.  Participation Level:  Active  Participation Quality:  Appropriate  Affect:  Appropriate  Cognitive:  Appropriate  Insight: Appropriate  Engagement in Group:  Engaged  Modes of Intervention:  Discussion  Additional Comments:  Andruw attend AA wrap up group.  Lang Drilling Long 08/16/2023, 8:25 PM

## 2023-08-16 NOTE — BHH Suicide Risk Assessment (Signed)
 BHH INPATIENT:  Family/Significant Other Suicide Prevention Education  Suicide Prevention Education:  Education Completed; Lance Chapman [phone] (brother),  (name of family member/significant other) has been identified by the patient as the family member/significant other with whom the patient will be residing, and identified as the person(s) who will aid the patient in the event of a mental health crisis (suicidal ideations/suicide attempt).  With written consent from the patient, the family member/significant other has been provided the following suicide prevention education, prior to the and/or following the discharge of the patient.  The suicide prevention education provided includes the following: Suicide risk factors Suicide prevention and interventions National Suicide Hotline telephone number [facility] assessment telephone number [facility] Emergency Assistance 911 [facility] and/or Residential Mobile Crisis Unit telephone number  Request made of family/significant other to: Remove weapons (e.g., guns, rifles, knives), all items previously/currently identified as safety concern.   Remove drugs/medications (over-the-counter, prescriptions, illicit drugs), all items previously/currently identified as a safety concern.  Patient's brother denies any weapons in the home or history of medication misuse.   The family member/significant other verbalizes understanding of the suicide prevention education information provided.  The family member/significant other agrees to remove the items of safety concern listed above.  Lance Chapman 08/16/2023, 11:20 AM

## 2023-08-16 NOTE — Progress Notes (Signed)
 Adult Psychoeducational Group Note  Date:  08/16/2023 Time:  10:44 AM  Group Topic/Focus:  Goals Group:   The focus of this group is to help patients establish daily goals to achieve during treatment and discuss how the patient can incorporate goal setting into their daily lives to aide in recovery.  Participation Level:  Active  Participation Quality:  Appropriate  Affect:  Appropriate  Cognitive:  Appropriate  Insight: Appropriate  Engagement in Group:  Engaged  Modes of Intervention:  Discussion  Additional Comments:  Pt did attend group and actively participated.  Daine Pillar D 08/16/2023, 10:44 AM

## 2023-08-16 NOTE — Plan of Care (Signed)
  Problem: Activity: Goal: Interest or engagement in activities will improve Outcome: Progressing Goal: Sleeping patterns will improve Outcome: Progressing   Problem: Coping: Goal: Ability to verbalize frustrations and anger appropriately will improve Outcome: Progressing Goal: Ability to demonstrate self-control will improve Outcome: Progressing   Problem: Safety: Goal: Periods of time without injury will increase Outcome: Progressing

## 2023-08-16 NOTE — Group Note (Signed)
 Date:  08/16/2023 Time:  4:38 PM  Group Topic/Focus:  Exploring Anxiety    Participation Level:  Active  Participation Quality:  Appropriate, Sharing, and Supportive  Affect:  Appropriate  Cognitive:  Alert and Appropriate  Insight: Appropriate  Engagement in Group:  Engaged and Supportive  Modes of Intervention:  Activity and Discussion  Additional Comments:  Pt attentive and offers discussion during group  [name] 08/16/2023, 4:38 PM

## 2023-08-16 NOTE — Progress Notes (Signed)
   08/16/23 0935  Psych Admission Type (Psych Patients Only)  Admission Status Voluntary  Psychosocial Assessment  Patient Complaints Anxiety;Depression  Eye Contact Brief  Facial Expression Anxious  Affect Appropriate to circumstance  Speech Logical/coherent  Interaction Assertive  Motor Activity Slow  Appearance/Hygiene Unremarkable  Behavior Characteristics Cooperative;Appropriate to situation  Mood Depressed;Anxious;Pleasant  Thought Process  Coherency WDL  Content WDL  Delusions WDL  Perception WDL  Hallucination None reported or observed  Judgment Poor  Confusion None  Danger to Self  Current suicidal ideation? Denies  Danger to Others  Danger to Others None reported or observed

## 2023-08-16 NOTE — BH IP Treatment Plan (Signed)
 Interdisciplinary Treatment and Diagnostic Plan Update  08/16/2023 Time of Session: 10:45 AM RITHY MANDLEY MRN: [id number]  Principal Diagnosis: MDD (major depressive disorder), recurrent severe, without psychosis (HCC)  Secondary Diagnoses: Principal Problem:   MDD (major depressive disorder), recurrent severe, without psychosis (HCC)   Current Medications:  Current Facility-Administered Medications  Medication Dose Route Frequency Provider Last Rate Last Admin   acetaminophen  (TYLENOL ) tablet 1,000 mg  1,000 mg Oral Q6H PRN McCarty, Artie, MD       atorvastatin  (LIPITOR) tablet 10 mg  10 mg Oral Daily McCarty, Artie, MD   10 mg at 08/16/23 0800   empagliflozin  (JARDIANCE ) tablet 10 mg  10 mg Oral Daily McCarty, Artie, MD   10 mg at 08/16/23 0800   FLUoxetine  (PROZAC ) capsule 80 mg  80 mg Oral Daily McCarty, Artie, MD   80 mg at 08/16/23 0800   gabapentin  (NEURONTIN ) capsule 100 mg  100 mg Oral QHS Onuoha, Chinwendu V, NP   100 mg at 08/15/23 2156   lisinopril  (ZESTRIL ) tablet 20 mg  20 mg Oral Daily Mark Bard LABOR, RPH   20 mg at 08/16/23 0800   And   hydrochlorothiazide  (HYDRODIURIL ) tablet 12.5 mg  12.5 mg Oral Daily Mark Bard LABOR, RPH   12.5 mg at 08/16/23 0800   hydrOXYzine  (ATARAX ) tablet 10 mg  10 mg Oral TID PRN McCarty, Artie, MD   10 mg at 08/15/23 2156   insulin  aspart (novoLOG ) injection 0-15 Units  0-15 Units Subcutaneous TID WC Onuoha, Chinwendu V, NP   3 Units at 08/16/23 1711   insulin  aspart (novoLOG ) injection 0-5 Units  0-5 Units Subcutaneous QHS Onuoha, Chinwendu V, NP   3 Units at 08/15/23 2154   mirtazapine  (REMERON ) tablet 15 mg  15 mg Oral QHS Izediuno, Jerrell LABOR, MD       OLANZapine  (ZYPREXA ) injection 5 mg  5 mg Intramuscular TID PRN Onuoha, Chinwendu V, NP       OLANZapine  zydis (ZYPREXA ) disintegrating tablet 5 mg  5 mg Oral TID PRN Onuoha, Chinwendu V, NP       traZODone  (DESYREL ) tablet 50 mg  50 mg Oral QHS PRN McCarty, Artie, MD   50 mg at 08/15/23  2156   PTA Medications: Medications Prior to Admission  Medication Sig Dispense Refill Last Dose/Taking   acetaminophen  (TYLENOL ) 500 MG tablet Take 1,000 mg by mouth every 6 (six) hours as needed for headache, mild pain (pain score 1-3) or moderate pain (pain score 4-6).      atorvastatin  (LIPITOR) 10 MG tablet Take 10 mg by mouth daily.      EPINEPHrine 0.3 mg/0.3 mL IJ SOAJ injection Inject 0.3 mg into the muscle as needed for anaphylaxis.      FLUoxetine  (PROZAC ) 40 MG capsule Take 2 capsules (80 mg total) by mouth daily. 180 capsule 0    hydrOXYzine  (ATARAX ) 10 MG tablet Take 1 tablet (10 mg total) by mouth 3 (three) times daily as needed. 270 tablet 0    JARDIANCE  10 MG TABS tablet Take 10 mg by mouth daily.      lisinopril -hydrochlorothiazide  (ZESTORETIC ) 20-12.5 MG tablet Take 1 tablet by mouth daily.      OZEMPIC, 0.25 OR 0.5 MG/DOSE, 2 MG/3ML SOPN Inject 2.5 mLs into the skin once a week. On Fridays      traZODone  (DESYREL ) 50 MG tablet Take 1 tablet (50 mg total) by mouth at bedtime as needed for sleep. 90 tablet 0    TRESIBA  FLEXTOUCH 100  UNIT/ML FlexTouch Pen Inject 20 Units into the skin in the morning.      valACYclovir  (VALTREX ) 1000 MG tablet Take 1 tablet (1,000 mg total) by mouth 3 (three) times daily. (Patient taking differently: Take 1,000 mg by mouth 3 (three) times daily as needed (breakout).) 21 tablet 0     Patient Stressors: Health problems   Medication change or noncompliance    Patient Strengths: Capable of independent living  Motivation for treatment/growth   Treatment Modalities: Medication Management, Group therapy, Case management,  1 to 1 session with clinician, Psychoeducation, Recreational therapy.   Physician Treatment Plan for Primary Diagnosis: MDD (major depressive disorder), recurrent severe, without psychosis (HCC) Long Term Goal(s): Improvement in symptoms so as ready for discharge   Short Term Goals: Ability to identify changes in lifestyle to  reduce recurrence of condition will improve  Medication Management: Evaluate patient's response, side effects, and tolerance of medication regimen.  Therapeutic Interventions: 1 to 1 sessions, Unit Group sessions and Medication administration.  Evaluation of Outcomes: Not Progressing  Physician Treatment Plan for Secondary Diagnosis: Principal Problem:   MDD (major depressive disorder), recurrent severe, without psychosis (HCC)  Long Term Goal(s): Improvement in symptoms so as ready for discharge   Short Term Goals: Ability to identify changes in lifestyle to reduce recurrence of condition will improve     Medication Management: Evaluate patient's response, side effects, and tolerance of medication regimen.  Therapeutic Interventions: 1 to 1 sessions, Unit Group sessions and Medication administration.  Evaluation of Outcomes: Not Progressing   RN Treatment Plan for Primary Diagnosis: MDD (major depressive disorder), recurrent severe, without psychosis (HCC) Long Term Goal(s): Knowledge of disease and therapeutic regimen to maintain health will improve  Short Term Goals: Ability to remain free from injury will improve, Ability to verbalize frustration and anger appropriately will improve, Ability to demonstrate self-control, Ability to participate in decision making will improve, Ability to verbalize feelings will improve, Ability to disclose and discuss suicidal ideas, Ability to identify and develop effective coping behaviors will improve, and Compliance with prescribed medications will improve  Medication Management: RN will administer medications as ordered by provider, will assess and evaluate patient's response and provide education to patient for prescribed medication. RN will report any adverse and/or side effects to prescribing provider.  Therapeutic Interventions: 1 on 1 counseling sessions, Psychoeducation, Medication administration, Evaluate responses to treatment, Monitor  vital signs and CBGs as ordered, Perform/monitor CIWA, COWS, AIMS and Fall Risk screenings as ordered, Perform wound care treatments as ordered.  Evaluation of Outcomes: Not Progressing   LCSW Treatment Plan for Primary Diagnosis: MDD (major depressive disorder), recurrent severe, without psychosis (HCC) Long Term Goal(s): Safe transition to appropriate next level of care at discharge, Engage patient in therapeutic group addressing interpersonal concerns.  Short Term Goals: Engage patient in aftercare planning with referrals and resources, Increase social support, Increase ability to appropriately verbalize feelings, Increase emotional regulation, Facilitate acceptance of mental health diagnosis and concerns, Facilitate patient progression through stages of change regarding substance use diagnoses and concerns, Identify triggers associated with mental health/substance abuse issues, and Increase skills for wellness and recovery  Therapeutic Interventions: Assess for all discharge needs, 1 to 1 time with Social worker, Explore available resources and support systems, Assess for adequacy in community support network, Educate family and significant other(s) on suicide prevention, Complete Psychosocial Assessment, Interpersonal group therapy.  Evaluation of Outcomes: Not Progressing   Progress in Treatment: Attending groups: Yes. Participating in groups: Yes. Taking medication as  prescribed: Yes. Toleration medication: Yes. Family/Significant other contact made: No, will contact:  Ayeden Gladman [phone] (brother) Patient understands diagnosis: Yes. Discussing patient identified problems/goals with staff: Yes. Medical problems stabilized or resolved: Yes. Denies suicidal/homicidal ideation: Yes. Issues/concerns per patient self-inventory: No.  New problem(s) identified:  No  New Short Term/Long Term Goal(s):   medication stabilization, elimination of SI thoughts, development of  comprehensive mental wellness plan.    Patient Goals:  It's the one-year anniversary of my mom's passing, and I've found myself grieving more.    Discharge Plan or Barriers:  Patient recently admitted. CSW will continue to follow and assess for appropriate referrals and possible discharge planning.     Reason for Continuation of Hospitalization: Anxiety Depression Medication stabilization  Estimated Length of Stay:  5 - 7 days  Last 3 [place] Suicide Severity Risk Score: Flowsheet Row Ad[facility] (Current) [facility] from 08/14/2023 in [facility] ED from 08/13/2021 in [facility] Emergency Department at [facility]  C-SSRS RISK CATEGORY No Risk No Risk No Risk    Last PHQ 2/9 Scores:    10/19/2021    3:55 PM 07/19/2021    1:27 PM 07/10/2021   11:59 AM  Depression screen PHQ 2/9  Decreased Interest 3 3 1   Down, Depressed, Hopeless 2 2 1   PHQ - 2 Score 5 5 2   Altered sleeping 3 2 2   Tired, decreased energy 0 0 1  Change in appetite 2 2 1   Feeling bad or failure about yourself  1 0 2  Trouble concentrating 2 0 2  Moving slowly or fidgety/restless 2 2 1   Suicidal thoughts 0 0 0  PHQ-9 Score 15 11 11   Difficult doing work/chores Somewhat difficult Not difficult at all Somewhat difficult    Scribe for Treatment Team: Lenix Benoist O Domnique Vantine, LCSWA 08/16/2023 6:07 PM

## 2023-08-16 NOTE — H&P (Signed)
 Psychiatric Admission Assessment Adult  Patient Identification: Lance Chapman MRN:  [id number] Date of Evaluation:  08/16/2023 Chief Complaint: Worsening depression Principal Diagnosis: MDD (major depressive disorder), recurrent severe, without psychosis (HCC) Diagnosis:  Principal Problem:   MDD (major depressive disorder), recurrent severe, without psychosis (HCC)  History of Present Illness:  58 year old African-American male, single, unemployed, on SSI disability, lives alone.  Background history of MDD recurrent, grief PTSD and multiple medical comorbidity presented voluntarily to urgent care on account of worsening depression.  Patient has been grieving the loss of his mother who passed in July last year.  His father's anniversary is this months.  Patient does not have any family history of mental illness.  He has suffered from depression and PTSD for years.  He has always lived with his family.  States that he took care of his mother who had dementia.  His mother went to spend time with his other brother for 2 weeks when his mother passed.  Patient is not expressing any guilty ruminations.  States that this just been tough for him since his mother died.  He currently lives in the house alone.  Patient reports difficulty getting into sleep at night.  States that he gets about 4 hours of sleep at night.  His appetite has not been that great.  His physical and mental energy has not been that great.  States that he has been dwelling a lot on all the negative things that has happened in his life.  His father passed in August 1995.  Grief as his major stressor.  Patient comes from a big family.  All of his siblings have a Hotel manager background.  States that his siblings supportive.  No legal issues.  No tussle over his parents house.  The house is fully paid.  No interpersonal relational issues.   No associated suicidal thoughts.  No associated manic symptoms.  No associated psychotic symptoms.   Patient is not reporting any acute PTSD symptoms.  No use of alcohol or any psychoactive substance to cope.  No evidence of OCD.  Total Time spent with patient: 1 hour  Past Psychiatric History:  Established history of MDD recurrent and PTSD.  Onset of mental illness was in his 37s.  He has been treated with fluoxetine  over the years.  States that he has not tried any other psychotropic medication as he did well on fluoxetine .  This is his first inpatient admission.  Single episode of slitting his wrists in the late 80s due to depression.  No other form of suicidal behavior.  No history of violent behavior.  No prior history of mania.  No prior history of psychosis. Patient is linked to a therapist in the community.  He also gets grief counseling through the hospice.  He is linked to his psychiatrist in the community.  He follows up regularly. No history of substance use.  No past history of addiction treatment. No past history of neuromodulation.  [place] Scale:  Flowsheet Row Ad[facility] (Current) [facility] from 08/14/2023 in [facility] ED from 08/13/2021 in [facility] Emergency Department at [facility]  C-SSRS RISK CATEGORY No Risk No Risk No Risk      Alcohol Screening: 1. How often do you have a drink containing alcohol?: Never 2. How many drinks containing alcohol do you have on a typical day when you are drinking?: 1 or 2 3. How often do you have  six or more drinks on one occasion?: Never AUDIT-C Score: 0 4. How often during the last year have you found that you were not able to stop drinking once you had started?: Never 5. How often during the last year have you failed to do what was normally expected from you because of drinking?: Never 6. How often during the last year have you needed a first drink in the morning to get yourself going after a heavy drinking session?: Never 7. How often  during the last year have you had a feeling of guilt of remorse after drinking?: Never 8. How often during the last year have you been unable to remember what happened the night before because you had been drinking?: Never 9. Have you or someone else been injured as a result of your drinking?: No 10. Has a relative or friend or a doctor or another health worker been concerned about your drinking or suggested you cut down?: No Alcohol Use Disorder Identification Test Final Score (AUDIT): 0 Alcohol Brief Interventions/Follow-up: Alcohol education/Brief advice  Past Medical History:  Past Medical History:  Diagnosis Date   Anxiety    Back pain, chronic    Depression    Hypertension    UTI (lower urinary tract infection)    History reviewed. No pertinent surgical history. Family History:  Family History  Problem Relation Age of Onset   Depression Mother    Dementia Mother    Family Psychiatric  History:  No family history of mental illness.  No family history of addiction.  No family history of suicide.  No family history of sudden cardiac death.  Tobacco Screening:  Social History   Tobacco Use  Smoking Status Never  Smokeless Tobacco Never    BH Tobacco Counseling     Are you interested in Tobacco Cessation Medications?  No value filed. Counseled patient on smoking cessation:  No value filed. Reason Tobacco Screening Not Completed: No value filed.       Social History:  Social History   Substance and Sexual Activity  Alcohol Use No     Social History   Substance and Sexual Activity  Drug Use No    Additional Social History: Marital status: Single Are you sexually active?: No What is your sexual orientation?: Heterosexual Has your sexual activity been affected by drugs, alcohol, medication, or emotional stress?: None reported Does patient have children?: No                         Allergies:   Allergies  Allergen Reactions   Bee Venom Anaphylaxis    Benadryl Allergy [Diphenhydramine] Anaphylaxis   Lab Results:  Results for orders placed or performed during the hospital encounter of 08/15/23 (from the past 48 hours)  Glucose, capillary     Status: Abnormal   Collection Time: 08/15/23  9:51 PM  Result Value Ref Range   Glucose-Capillary 252 (H) 70 - 99 mg/dL    Comment: Glucose reference range applies only to samples taken after fasting for at least 8 hours.   Comment 1 Notify RN    Comment 2 Document in Chart   Glucose, capillary     Status: Abnormal   Collection Time: 08/16/23  5:51 AM  Result Value Ref Range   Glucose-Capillary 118 (H) 70 - 99 mg/dL    Comment: Glucose reference range applies only to samples taken after fasting for at least 8 hours.  Glucose, capillary     Status:  Abnormal   Collection Time: 08/16/23 11:28 AM  Result Value Ref Range   Glucose-Capillary 159 (H) 70 - 99 mg/dL    Comment: Glucose reference range applies only to samples taken after fasting for at least 8 hours.    Blood Alcohol level:  Lab Results  Component Value Date   ETH <10 08/13/2021   ETH <10 05/14/2020    Metabolic Disorder Labs:  Lab Results  Component Value Date   HGBA1C 6.4 (H) 08/14/2023   MPG 136.98 08/14/2023   MPG 182.9 11/17/2020   Lab Results  Component Value Date   PROLACTIN 8.6 11/17/2020   PROLACTIN QUANTITY NOT SUFFICIENT, UNABLE TO PERFORM TEST 05/13/2020   Lab Results  Component Value Date   CHOL 142 08/14/2023   TRIG 44 08/14/2023   HDL 65 08/14/2023   CHOLHDL 2.2 08/14/2023   VLDL 9 08/14/2023   LDLCALC 68 08/14/2023   LDLCALC 153 (H) 11/17/2020    Current Medications: Current Facility-Administered Medications  Medication Dose Route Frequency Provider Last Rate Last Admin   acetaminophen  (TYLENOL ) tablet 1,000 mg  1,000 mg Oral Q6H PRN McCarty, Artie, MD       atorvastatin  (LIPITOR) tablet 10 mg  10 mg Oral Daily McCarty, Artie, MD   10 mg at 08/16/23 0800   empagliflozin  (JARDIANCE ) tablet 10 mg   10 mg Oral Daily McCarty, Artie, MD   10 mg at 08/16/23 0800   FLUoxetine  (PROZAC ) capsule 80 mg  80 mg Oral Daily McCarty, Artie, MD   80 mg at 08/16/23 0800   gabapentin  (NEURONTIN ) capsule 100 mg  100 mg Oral QHS Onuoha, Chinwendu V, NP   100 mg at 08/15/23 2156   lisinopril  (ZESTRIL ) tablet 20 mg  20 mg Oral Daily Mark Bard LABOR, RPH   20 mg at 08/16/23 0800   And   hydrochlorothiazide  (HYDRODIURIL ) tablet 12.5 mg  12.5 mg Oral Daily Mark Bard LABOR, RPH   12.5 mg at 08/16/23 0800   hydrOXYzine  (ATARAX ) tablet 10 mg  10 mg Oral TID PRN McCarty, Artie, MD   10 mg at 08/15/23 2156   insulin  aspart (novoLOG ) injection 0-15 Units  0-15 Units Subcutaneous TID WC Onuoha, Chinwendu V, NP   3 Units at 08/16/23 1153   insulin  aspart (novoLOG ) injection 0-5 Units  0-5 Units Subcutaneous QHS Onuoha, Chinwendu V, NP   3 Units at 08/15/23 2154   OLANZapine  (ZYPREXA ) injection 5 mg  5 mg Intramuscular TID PRN Onuoha, Chinwendu V, NP       OLANZapine  zydis (ZYPREXA ) disintegrating tablet 5 mg  5 mg Oral TID PRN Onuoha, Chinwendu V, NP       traZODone  (DESYREL ) tablet 50 mg  50 mg Oral QHS PRN McCarty, Artie, MD   50 mg at 08/15/23 2156   PTA Medications: Medications Prior to Admission  Medication Sig Dispense Refill Last Dose/Taking   acetaminophen  (TYLENOL ) 500 MG tablet Take 1,000 mg by mouth every 6 (six) hours as needed for headache, mild pain (pain score 1-3) or moderate pain (pain score 4-6).      atorvastatin  (LIPITOR) 10 MG tablet Take 10 mg by mouth daily.      EPINEPHrine 0.3 mg/0.3 mL IJ SOAJ injection Inject 0.3 mg into the muscle as needed for anaphylaxis.      FLUoxetine  (PROZAC ) 40 MG capsule Take 2 capsules (80 mg total) by mouth daily. 180 capsule 0    hydrOXYzine  (ATARAX ) 10 MG tablet Take 1 tablet (10 mg total) by mouth 3 (  three) times daily as needed. 270 tablet 0    JARDIANCE  10 MG TABS tablet Take 10 mg by mouth daily.      lisinopril -hydrochlorothiazide  (ZESTORETIC ) 20-12.5 MG  tablet Take 1 tablet by mouth daily.      OZEMPIC, 0.25 OR 0.5 MG/DOSE, 2 MG/3ML SOPN Inject 2.5 mLs into the skin once a week. On Fridays      traZODone  (DESYREL ) 50 MG tablet Take 1 tablet (50 mg total) by mouth at bedtime as needed for sleep. 90 tablet 0    TRESIBA  FLEXTOUCH 100 UNIT/ML FlexTouch Pen Inject 20 Units into the skin in the morning.      valACYclovir  (VALTREX ) 1000 MG tablet Take 1 tablet (1,000 mg total) by mouth 3 (three) times daily. (Patient taking differently: Take 1,000 mg by mouth 3 (three) times daily as needed (breakout).) 21 tablet 0     AIMS:  ,  ,  ,  ,  ,  ,    Musculoskeletal: Strength & Muscle Tone: within normal limits Gait & Station: normal Patient leans: N/A   Psychiatric Specialty Exam:  Presentation  General Appearance:  Casually dressed, not in any distress, good relatedness.  Eye Contact: Good.  Speech: Spontaneous.  Soft spoken.  Mood and Affect  Mood: Depressed.  Affect: Restricted and mood-congruent.  Thought Process  Thought Processes: Linear and goal directed.  Descriptions of Associations:Intact  Orientation:Full (Time, Place and Person)  Thought Content: Negative rumination about his losses.  No guilty rumination.  No current suicidal thoughts.  No homicidal thoughts.  No thoughts of violence.   No delusional theme.  No obsessions.  Hallucinations: No hallucination in any modality.  Sensorium  Memory: Good.  Judgment: Good.  Insight: Good  Executive Functions  Concentration: Good.  Attention Span: Good.  Recall: Good.  Fund of Knowledge: Good.  Language: Good   Psychomotor Activity  Slightly decreased psychomotor activity   Physical Exam: Physical Exam Constitutional:      Appearance: Normal appearance.  HENT:     Head: Normocephalic and atraumatic.     Nose: Nose normal.  Eyes:     Extraocular Movements: Extraocular movements intact.     Pupils: Pupils are equal, round, and reactive  to light.  Cardiovascular:     Pulses: Normal pulses.  Pulmonary:     Effort: Pulmonary effort is normal.  Musculoskeletal:        General: Normal range of motion.     Cervical back: Normal range of motion.  Skin:    General: Skin is warm and dry.  Neurological:     Mental Status: He is alert and oriented to person, place, and time.    Review of Systems  Constitutional: Negative.   HENT: Negative.    Eyes: Negative.   Respiratory: Negative.    Cardiovascular: Negative.   Gastrointestinal: Negative.   Genitourinary: Negative.   Musculoskeletal: Negative.   Skin: Negative.   Neurological: Negative.   Endo/Heme/Allergies: Negative.    Blood pressure 130/85, pulse 92, temperature 98.2 F (36.8 C), temperature source Oral, resp. rate 20, height 5' 9 (1.753 m), weight 79.4 kg, SpO2 99%. Body mass index is 25.84 kg/m.  Treatment Plan Summary: Patient with history of trauma, PTSD and depression.  More depressed lately in context of parents anniversary.  He has been struggling to cope alone at home.  No associated psychotic features.  No associated substance use.  No associated manic features. Will add mirtazapine  to his current regimen and evaluate him further.  Observation Level/Precautions:  15 minute checks  Laboratory:  No acute labs needed.  Psychotherapy:    Medications:   Fluoxetine  80 mg daily. Mirtazapine  15 mg at bedtime  Consultations:    Discharge Concerns:    Estimated LOS:  Other:     Physician Treatment Plan for Primary Diagnosis: MDD (major depressive disorder), recurrent severe, without psychosis (HCC) Long Term Goal(s): Improvement in symptoms so as ready for discharge  Short Term Goals: Ability to identify changes in lifestyle to reduce recurrence of condition will improve  Physician Treatment Plan for Secondary Diagnosis: Principal Problem:   MDD (major depressive disorder), recurrent severe, without psychosis (HCC)  Long Term Goal(s): Improvement in  symptoms so as ready for discharge  Short Term Goals: Ability to identify changes in lifestyle to reduce recurrence of condition will improve  I certify that inpatient services furnished can reasonably be expected to improve the patient's condition.    Jerrell DELENA Forehand, MD 8/15/20251:46 PM
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
## 2023-08-16 NOTE — Plan of Care (Signed)
   Problem: Education: Goal: Knowledge of [facility] [facility] Education information/materials will improve Outcome: Progressing Goal: Emotional status will improve Outcome: Progressing Goal: Mental status will improve Outcome: Progressing

## 2023-08-17 DIAGNOSIS — F332 Major depressive disorder, recurrent severe without psychotic features: Secondary | ICD-10-CM | POA: Diagnosis not present

## 2023-08-17 LAB — GLUCOSE, CAPILLARY
Glucose-Capillary: 128 mg/dL — ABNORMAL HIGH (ref 70–99)
Glucose-Capillary: 182 mg/dL — ABNORMAL HIGH (ref 70–99)
Glucose-Capillary: 164 mg/dL — ABNORMAL HIGH (ref 70–99)
Glucose-Capillary: 168 mg/dL — ABNORMAL HIGH (ref 70–99)

## 2023-08-17 NOTE — Group Note (Signed)
 Date:  08/17/2023 Time:  11:32 AM  Group Topic/Focus:  Goals Group:   The focus of this group is to help patients establish daily goals to achieve during treatment and discuss how the patient can incorporate goal setting into their daily lives to aide in recovery. Orientation:   The focus of this group is to educate the patient on the purpose and policies of crisis stabilization and provide a format to answer questions about their admission.  The group details unit policies and expectations of patients while admitted.    Participation Level:  Did Not Attend
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
## 2023-08-17 NOTE — BHH Group Notes (Signed)
 LCSW Group Therapy Note  08/17/2023   10:00am-11:00am  Type of Therapy and Topic:  Group Therapy: Gratitude  Participation Level:  Active   Description of Group:   In this group, patients shared and discussed the importance of acknowledging the elements in their lives for which they are grateful and how this can positively impact their mood.  The group discussed how bringing the positive elements of their lives to the forefront of their minds can help with recovery from any illness, physical or mental.  An exercise was done as a group in which a list was made of gratitude items in order to encourage participants to consider other potential positives in their lives.  Therapeutic Goals: Patients will discuss quotes about gratitude and explore how a change of attitude can make life more joyful. Patients will identify one or more items for which they are grateful in each of 6 categories:  people, experiences, things, places, skills, and other. Patients will discuss how it is possible to seek out gratitude in even bad situations. Patients will explore how the lack of gratitude can bring them down.   Summary of Patient Progress:  The patient shared that he is grateful for having a future as opposed to not having one, stating he looks forward to what every day will bring.  Patient's reaction to the group was very positive and he was encouraging to others both directly and through his positive shares.  Therapeutic Modalities:   Solution-Focused Therapy Activity  Shamaya Kauer J Grossman-Orr, LCSW .

## 2023-08-17 NOTE — Progress Notes (Signed)
(  Sleep Hours) -8.0 (Any PRNs that were needed, meds refused, or side effects to meds)-prn hydroxyzine  10mg  and trazodone  50mg  given @ 2113  (Any disturbances and when (visitation, over night)-none (Concerns raised by the patient)- none (SI/HI/AVH)- Denies all

## 2023-08-17 NOTE — Progress Notes (Signed)
   08/17/23 0925  Psych Admission Type (Psych Patients Only)  Admission Status Voluntary  Psychosocial Assessment  Patient Complaints Anxiety;Depression  Eye Contact Fair  Facial Expression Anxious  Affect Appropriate to circumstance  Speech Logical/coherent  Interaction Assertive  Motor Activity Other (Comment) (WNL)  Appearance/Hygiene Unremarkable  Behavior Characteristics Appropriate to situation;Cooperative  Mood Depressed;Pleasant  Thought Process  Coherency WDL  Content WDL  Delusions None reported or observed  Perception WDL  Hallucination None reported or observed  Judgment Poor  Confusion None  Danger to Self  Current suicidal ideation? Denies  Agreement Not to Harm Self Yes  Description of Agreement Verbal  Danger to Others  Danger to Others None reported or observed

## 2023-08-17 NOTE — Progress Notes (Signed)
 Adult Psychoeducational Group Note  Date:  08/17/2023 Time:  8:53 PM  Group Topic/Focus:  Wrap-Up Group:   The focus of this group is to help patients review their daily goal of treatment and discuss progress on daily workbooks.  Participation Level:  Active  Participation Quality:  Appropriate  Affect:  Appropriate  Cognitive:  Appropriate  Insight: Appropriate  Engagement in Group:  Engaged  Modes of Intervention:  Discussion  Additional Comments:  Pt goal for the day was to connect with family. Pt met goal.  Daine Lonita BIRCH 08/17/2023, 8:53 PM

## 2023-08-17 NOTE — Plan of Care (Signed)
  Problem: Activity: Goal: Interest or engagement in activities will improve Outcome: Progressing Goal: Sleeping patterns will improve Outcome: Progressing   Problem: Coping: Goal: Ability to verbalize frustrations and anger appropriately will improve Outcome: Progressing Goal: Ability to demonstrate self-control will improve Outcome: Progressing   Problem: Safety: Goal: Periods of time without injury will increase Outcome: Progressing   Problem: Nutritional: Goal: Maintenance of adequate nutrition will improve Outcome: Progressing Goal: Progress toward achieving an optimal weight will improve Outcome: Progressing

## 2023-08-17 NOTE — Group Note (Signed)
 Date:  08/17/2023 Time:  6:08 PM  Group Topic/Focus:  Coping With Mental Health Crisis:   The purpose of this group is to help patients identify strategies for coping with mental health crisis.  Group discusses possible causes of crisis and ways to manage them effectively. Developing a Wellness Toolbox:   The focus of this group is to help patients develop a wellness toolbox with skills and strategies to promote recovery upon discharge. Identifying Needs:   The focus of this group is to help patients identify their personal needs that have been historically problematic and identify healthy behaviors to address their needs. Wellness Toolbox:   The focus of this group is to discuss various aspects of wellness, balancing those aspects and exploring ways to increase the ability to experience wellness.  Patients will create a wellness toolbox for use upon discharge.    Participation Level:  Active  Participation Quality:  Appropriate  Affect:  Appropriate  Cognitive:  Appropriate  Insight: Appropriate  Engagement in Group:  Engaged  Modes of Intervention:  Discussion  Additional Comments:    Lance Chapman 08/17/2023, 6:08 PM

## 2023-08-18 DIAGNOSIS — F332 Major depressive disorder, recurrent severe without psychotic features: Secondary | ICD-10-CM | POA: Diagnosis not present

## 2023-08-18 LAB — GLUCOSE, CAPILLARY
Glucose-Capillary: 161 mg/dL — ABNORMAL HIGH (ref 70–99)
Glucose-Capillary: 167 mg/dL — ABNORMAL HIGH (ref 70–99)
Glucose-Capillary: 187 mg/dL — ABNORMAL HIGH (ref 70–99)
Glucose-Capillary: 221 mg/dL — ABNORMAL HIGH (ref 70–99)

## 2023-08-18 NOTE — Group Note (Signed)
 Date:  08/18/2023 Time:  4:16 PM  Group Topic/Focus: Emotional Diary Emotional Education:   The focus of this group is to discuss what feelings/emotions are, and how they are experienced.    Participation Level:  Active  Participation Quality:  Appropriate  Affect:  Appropriate  Cognitive:  Alert  Insight: Appropriate  Engagement in Group:  Engaged  Modes of Intervention:  Discussion  Additional Comments:  Patient engaged in group appropriately.   Marleni Gallardo D Avis Mcmahill 08/18/2023, 4:16 PM

## 2023-08-18 NOTE — Progress Notes (Signed)
   08/18/23 0836  Psych Admission Type (Psych Patients Only)  Admission Status Voluntary  Psychosocial Assessment  Patient Complaints Depression;Anxiety  Eye Contact Fair  Facial Expression Animated  Affect Appropriate to circumstance  Speech Logical/coherent  Interaction Assertive  Motor Activity Slow  Appearance/Hygiene Unremarkable  Behavior Characteristics Cooperative  Mood Pleasant  Thought Process  Coherency WDL  Content WDL  Delusions None reported or observed  Perception WDL  Hallucination None reported or observed  Judgment WDL  Confusion None  Danger to Self  Current suicidal ideation? Denies  Agreement Not to Harm Self Yes  Description of Agreement verbal  Danger to Others  Danger to Others None reported or observed

## 2023-08-18 NOTE — Progress Notes (Signed)
 [facility] MD Progress Note  08/18/2023 10:51 AM Lance Chapman  MRN:  [id number] Subjective:   58 year old African-American male, single, unemployed, on SSI disability, lives alone.  Background history of MDD recurrent, grief PTSD and multiple medical comorbidity presented voluntarily to urgent care on account of worsening depression.  Patient has been grieving the loss of his mother who passed in July last year.  His father's anniversary is this months.  Chart reviewed today.  Patient discussed at multidisciplinary team meeting.  Nursing staff reports that patient remains appropriate on the unit.  He is grooming himself appropriately.  He is interacting with peers appropriately and attends groups.  He is mobilizing affect appropriately.  No observed response to internal stimuli.  He is not endorsing any furtility thoughts.  Social worker reports that family is interested in feedback with respect to his mental state.  Major concerns seems to be ability to leave alone versus going into a group home.   Seen today.  Patient remains in good form.  States that he slept well with this medication.  His mood is upbeat.  Less negative ruminations lately.  He is enjoying his daily activities.  He is able to concentrate and focus on what he is doing.  No suicidal thoughts.  No violent thoughts towards others or property.  No manic symptoms.  No evidence of activation.  No evidence of psychosis.  He is tolerating his medication without any adverse effects.  Patient states that his family remains supportive.  A lot of his brother came to see him yesterday.  They are helping him maintain his home.  There are no new psychosocial stressors. Encouraged to keep ventilating his feelings to staff.  Principal Problem: MDD (major depressive disorder), recurrent severe, without psychosis (HCC) Diagnosis: Principal Problem:   MDD (major depressive disorder), recurrent severe, without psychosis (HCC)  Total Time spent with  patient: 30 minutes  Past Psychiatric History:  Established history of MDD recurrent and PTSD.  Onset of mental illness was in his 89s.  He has been treated with fluoxetine  over the years.  States that he has not tried any other psychotropic medication as he did well on fluoxetine .  This is his first inpatient admission.  Single episode of slitting his wrists in the late 80s due to depression.  No other form of suicidal behavior.  No history of violent behavior.  No prior history of mania.  No prior history of psychosis. Patient is linked to a therapist in the community.  He also gets grief counseling through the hospice.  He is linked to his psychiatrist in the community.  He follows up regularly. No history of substance use.  No past history of addiction treatment. No past history of neuromodulation.  Past Medical History:  Past Medical History:  Diagnosis Date   Anxiety    Back pain, chronic    Depression    Hypertension    UTI (lower urinary tract infection)    History reviewed. No pertinent surgical history. Family History:  Family History  Problem Relation Age of Onset   Depression Mother    Dementia Mother    Family Psychiatric  History:  No family history of mental illness. No family history of addiction. No family history of suicide. No family history of sudden cardiac death.   Social History:  Social History   Substance and Sexual Activity  Alcohol Use No     Social History   Substance and Sexual Activity  Drug Use No  Social History   Socioeconomic History   Marital status: Single    Spouse name: Not on file   Number of children: Not on file   Years of education: Not on file   Highest education level: Not on file  Occupational History   Not on file  Tobacco Use   Smoking status: Never   Smokeless tobacco: Never  Substance and Sexual Activity   Alcohol use: No   Drug use: No   Sexual activity: Not on file  Other Topics Concern   Not on file  Social  History Narrative   Not on file   Social Drivers of Health   Financial Resource Strain: Medium Risk (06/30/2023)   Received from [facility]   Overall Financial Resource Strain (CARDIA)    Difficulty of Paying Living Expenses: Somewhat hard  Food Insecurity: Food Insecurity Present (08/15/2023)   Hunger Vital Sign    Worried About Running Out of Food in the Last Year: Sometimes true    Ran Out of Food in the Last Year: Sometimes true  Transportation Needs: Unmet Transportation Needs (08/15/2023)   PRAPARE - Transportation    Lack of Transportation (Medical): Yes    Lack of Transportation (Non-Medical): Yes  Physical Activity: Inactive (06/30/2023)   Received from [facility]   Exercise Vital Sign    On average, how many days per week do you engage in moderate to strenuous exercise (like a brisk walk)?: 1 day    On average, how many minutes do you engage in exercise at this level?: 0 min  Stress: Stress Concern Present (06/30/2023)   Received from [facility] - Occupational Stress Questionnaire    Feeling of Stress : Very much  Social Connections: Socially Integrated (06/30/2023)   Received from [facility]   Social Network    How would you rate your social network (family, work, friends)?: Good participation with social networks   Additional Social History:    Current Medications: Current Facility-Administered Medications  Medication Dose Route Frequency Provider Last Rate Last Admin   acetaminophen  (TYLENOL ) tablet 1,000 mg  1,000 mg Oral Q6H PRN McCarty, Artie, MD       atorvastatin  (LIPITOR) tablet 10 mg  10 mg Oral Daily McCarty, Artie, MD   10 mg at 08/18/23 0836   empagliflozin  (JARDIANCE ) tablet 10 mg  10 mg Oral Daily McCarty, Artie, MD   10 mg at 08/18/23 0836   FLUoxetine  (PROZAC ) capsule 80 mg  80 mg Oral Daily McCarty, Artie, MD   80 mg at 08/18/23 0836   gabapentin  (NEURONTIN ) capsule 100 mg  100 mg Oral QHS Onuoha,  Chinwendu V, NP   100 mg at 08/17/23 2043   lisinopril  (ZESTRIL ) tablet 20 mg  20 mg Oral Daily Mark Bard LABOR, RPH   20 mg at 08/18/23 9163   And   hydrochlorothiazide  (HYDRODIURIL ) tablet 12.5 mg  12.5 mg Oral Daily Mark Bard LABOR, RPH   12.5 mg at 08/18/23 0836   hydrOXYzine  (ATARAX ) tablet 10 mg  10 mg Oral TID PRN McCarty, Artie, MD   10 mg at 08/16/23 2113   insulin  aspart (novoLOG ) injection 0-15 Units  0-15 Units Subcutaneous TID WC Onuoha, Chinwendu V, NP   3 Units at 08/18/23 0740   insulin  aspart (novoLOG ) injection 0-5 Units  0-5 Units Subcutaneous QHS Onuoha, Chinwendu V, NP   3 Units at 08/15/23 2154   mirtazapine  (REMERON ) tablet 15 mg  15 mg Oral QHS  Falen Lehrmann A, MD   15 mg at 08/17/23 2043   OLANZapine  (ZYPREXA ) injection 5 mg  5 mg Intramuscular TID PRN Onuoha, Chinwendu V, NP       OLANZapine  zydis (ZYPREXA ) disintegrating tablet 5 mg  5 mg Oral TID PRN Onuoha, Chinwendu V, NP       traZODone  (DESYREL ) tablet 50 mg  50 mg Oral QHS PRN McCarty, Artie, MD   50 mg at 08/16/23 2113    Lab Results:  Results for orders placed or performed during the hospital encounter of 08/15/23 (from the past 48 hours)  Glucose, capillary     Status: Abnormal   Collection Time: 08/16/23 11:28 AM  Result Value Ref Range   Glucose-Capillary 159 (H) 70 - 99 mg/dL    Comment: Glucose reference range applies only to samples taken after fasting for at least 8 hours.  Glucose, capillary     Status: Abnormal   Collection Time: 08/16/23  4:34 PM  Result Value Ref Range   Glucose-Capillary 154 (H) 70 - 99 mg/dL    Comment: Glucose reference range applies only to samples taken after fasting for at least 8 hours.  Glucose, capillary     Status: Abnormal   Collection Time: 08/16/23  7:24 PM  Result Value Ref Range   Glucose-Capillary 174 (H) 70 - 99 mg/dL    Comment: Glucose reference range applies only to samples taken after fasting for at least 8 hours.  Glucose, capillary     Status:  Abnormal   Collection Time: 08/17/23  5:58 AM  Result Value Ref Range   Glucose-Capillary 128 (H) 70 - 99 mg/dL    Comment: Glucose reference range applies only to samples taken after fasting for at least 8 hours.  Glucose, capillary     Status: Abnormal   Collection Time: 08/17/23 11:46 AM  Result Value Ref Range   Glucose-Capillary 182 (H) 70 - 99 mg/dL    Comment: Glucose reference range applies only to samples taken after fasting for at least 8 hours.  Glucose, capillary     Status: Abnormal   Collection Time: 08/17/23  5:02 PM  Result Value Ref Range   Glucose-Capillary 164 (H) 70 - 99 mg/dL    Comment: Glucose reference range applies only to samples taken after fasting for at least 8 hours.  Glucose, capillary     Status: Abnormal   Collection Time: 08/17/23  7:56 PM  Result Value Ref Range   Glucose-Capillary 168 (H) 70 - 99 mg/dL    Comment: Glucose reference range applies only to samples taken after fasting for at least 8 hours.  Glucose, capillary     Status: Abnormal   Collection Time: 08/18/23  6:34 AM  Result Value Ref Range   Glucose-Capillary 161 (H) 70 - 99 mg/dL    Comment: Glucose reference range applies only to samples taken after fasting for at least 8 hours.    Blood Alcohol level:  Lab Results  Component Value Date   ETH <10 08/13/2021   ETH <10 05/14/2020    Metabolic Disorder Labs: Lab Results  Component Value Date   HGBA1C 6.4 (H) 08/14/2023   MPG 136.98 08/14/2023   MPG 182.9 11/17/2020   Lab Results  Component Value Date   PROLACTIN 8.6 11/17/2020   PROLACTIN QUANTITY NOT SUFFICIENT, UNABLE TO PERFORM TEST 05/13/2020   Lab Results  Component Value Date   CHOL 142 08/14/2023   TRIG 44 08/14/2023   HDL 65 08/14/2023  CHOLHDL 2.2 08/14/2023   VLDL 9 08/14/2023   LDLCALC 68 08/14/2023   LDLCALC 153 (H) 11/17/2020    Physical Findings: AIMS:  ,  ,  ,  ,  ,  ,   CIWA:    COWS:     Musculoskeletal: Strength & Muscle Tone: within  normal limits Gait & Station: normal Patient leans: N/A  Psychiatric Specialty Exam:  Presentation  General Appearance:  Casually dressed, not in any distress, appropriate behavior, good relatedness.  No EPS.  Eye Contact: Good.  Speech: Spontaneous.  Normal rate, tone and volume.  Normal prosody of speech.  Mood and Affect  Mood: Euthymic.  Affect: Verbalize and affect appropriately.  Thought Process  Thought Processes: Linear and goal directed.  Descriptions of Associations:Intact  Orientation:Full (Time, Place and Person)  Thought Content: Future oriented.  No current suicidal thoughts.  No homicidal thoughts.  No thoughts of violence.  No negative ruminative flooding.  No guilty ruminations.  No delusional theme.  No obsessions.  Hallucinations: No hallucination in any modality.  Sensorium  Memory: Good.  Judgment: Good.  Insight: Good  Executive Functions  Concentration: Good.  Attention Span: Good.  Recall: Good.  Fund of Knowledge: Good.  Language: Good   Psychomotor Activity  Normal psychomotor activity    Physical Exam: Physical Exam ROS Blood pressure 125/84, pulse 89, temperature 98.4 F (36.9 C), temperature source Oral, resp. rate 14, height 5' 9 (1.753 m), weight 79.4 kg, SpO2 100%. Body mass index is 25.84 kg/m.   Treatment Plan Summary: Patient presented with worsening depression in context of anniversary of parental losses.  We augmented fluoxetine  with mirtazapine  during this admission.  He is sleeping better and interacting appropriately.  No dangerousness.  No manic features.  No psychotic features.  Family concerns about his ability to live alone versus group home placement will be a decision between family and patient.  Will maintain his current regimen and evaluate him further.  Hopeful discharge in a couple of days.  1.  Fluoxetine  80 mg daily. 2.  Mirtazapine  15 mg at bedtime. 3.  Continue home medical  medications at the same dose. 4.  Continue to encourage unit groups and therapeutic activities. 5.  Continue to monitor mood behavior and interaction with others. 6.  Social worker will coordinate discharge and aftercare planning.  Jerrell DELENA Forehand, MD 08/18/2023, 10:51 AM

## 2023-08-18 NOTE — Progress Notes (Signed)
   08/17/23 2100  Psych Admission Type (Psych Patients Only)  Admission Status Voluntary  Psychosocial Assessment  Patient Complaints Sadness  Eye Contact Fair  Facial Expression Anxious  Affect Appropriate to circumstance  Speech Logical/coherent  Interaction Assertive  Motor Activity Slow  Appearance/Hygiene Unremarkable  Behavior Characteristics Cooperative;Appropriate to situation  Mood Pleasant  Thought Process  Coherency WDL  Content WDL  Delusions None reported or observed  Perception WDL  Hallucination None reported or observed  Judgment WDL  Confusion None  Danger to Self  Current suicidal ideation? Denies  Agreement Not to Harm Self Yes  Description of Agreement verbal

## 2023-08-18 NOTE — Plan of Care (Signed)
  Problem: Education: Goal: Emotional status will improve Outcome: Progressing   Problem: Education: Goal: Mental status will improve Outcome: Progressing   Problem: Education: Goal: Verbalization of understanding the information provided will improve Outcome: Progressing   Problem: Activity: Goal: Interest or engagement in activities will improve Outcome: Progressing Goal: Sleeping patterns will improve Outcome: Progressing   Problem: Coping: Goal: Ability to verbalize frustrations and anger appropriately will improve Outcome: Progressing Goal: Ability to demonstrate self-control will improve Outcome: Progressing   Problem: Health Behavior/Discharge Planning: Goal: Identification of resources available to assist in meeting health care needs will improve Outcome: Progressing Goal: Compliance with treatment plan for underlying cause of condition will improve Outcome: Progressing   Problem: Safety: Goal: Periods of time without injury will increase Outcome: Progressing

## 2023-08-18 NOTE — Group Note (Unsigned)
 Date:  08/18/2023 Time:  11:51 AM  Group Topic/Focus:  Music Therapy     Participation Level:  {BHH PARTICIPATION OZCZO:77735}  Participation Quality:  {BHH PARTICIPATION QUALITY:22265}  Affect:  {BHH AFFECT:22266}  Cognitive:  {BHH COGNITIVE:22267}  Insight: {BHH Insight2:20797}  Engagement in Group:  {BHH ENGAGEMENT IN HMNLE:77731}  Modes of Intervention:  {BHH MODES OF INTERVENTION:22269}  Additional Comments:  ***  Lance Chapman 08/18/2023, 11:51 AM

## 2023-08-18 NOTE — Group Note (Signed)
 Date:  08/18/2023 Time:  10:35 AM  Group Topic/Focus:  Goals Group:   The focus of this group is to help patients establish daily goals to achieve during treatment and discuss how the patient can incorporate goal setting into their daily lives to aide in recovery.    Participation Level:  Active  Participation Quality:  Attentive  Affect:  Appropriate  Cognitive:  Alert  Insight: Good  Engagement in Group:  Engaged  Modes of Intervention:  Education  Additional Comments:  Seeking help to manage his stress and anxiety.  Lance Chapman 08/18/2023, 10:35 AM

## 2023-08-18 NOTE — Group Note (Signed)
 Date:  08/18/2023 Time:  9:56 PM  Group Topic/Focus:  Wrap-Up Group:   The focus of this group is to help patients review their daily goal of treatment and discuss progress on daily workbooks.    Participation Level:  Active  Participation Quality:  Appropriate and Attentive  Affect:  Appropriate  Cognitive:  Alert and Appropriate  Insight: Lacking  Engagement in Group:  Engaged  Modes of Intervention:  Discussion and Education  Additional Comments:  Pt attended and participated in wrap up group this evening and rated their day a 9/10. Pt stated that they had a pleasant phone call from their brother which lifted pt's spirit. Pt does not appear to have much insight about their goals in regards to their admission here, stating their goal is to be thankful. Pt has no concerns to relay at this time.   Lance Chapman 08/18/2023, 9:56 PM

## 2023-08-18 NOTE — Group Note (Unsigned)
 Date:  08/18/2023 Time:  10:40 AM  Group Topic/Focus:  Goals Group:   The focus of this group is to help patients establish daily goals to achieve during treatment and discuss how the patient can incorporate goal setting into their daily lives to aide in recovery.     Participation Level:  {BHH PARTICIPATION OZCZO:77735}  Participation Quality:  {BHH PARTICIPATION QUALITY:22265}  Affect:  {BHH AFFECT:22266}  Cognitive:  {BHH COGNITIVE:22267}  Insight: {BHH Insight2:20797}  Engagement in Group:  {BHH ENGAGEMENT IN HMNLE:77731}  Modes of Intervention:  {BHH MODES OF INTERVENTION:22269}  Additional Comments:  ***  Lance Chapman 08/18/2023, 10:40 AM

## 2023-08-19 ENCOUNTER — Ambulatory Visit (HOSPITAL_COMMUNITY)

## 2023-08-19 LAB — GLUCOSE, CAPILLARY
Glucose-Capillary: 111 mg/dL — ABNORMAL HIGH (ref 70–99)
Glucose-Capillary: 191 mg/dL — ABNORMAL HIGH (ref 70–99)
Glucose-Capillary: 191 mg/dL — ABNORMAL HIGH (ref 70–99)

## 2023-08-19 LAB — VITAMIN B12: Vitamin B-12: 380 pg/mL (ref 180–914)

## 2023-08-19 LAB — VITAMIN D 25 HYDROXY (VIT D DEFICIENCY, FRACTURES): Vit D, 25-Hydroxy: 26.83 ng/mL — ABNORMAL LOW (ref 30–100)

## 2023-08-19 LAB — FOLATE: Folate: 12.6 ng/mL (ref >5.9)

## 2023-08-19 NOTE — Progress Notes (Signed)
Pt did not attend emotional wellness group

## 2023-08-19 NOTE — Plan of Care (Signed)
   Problem: Education: Goal: Emotional status will improve Outcome: Progressing Goal: Verbalization of understanding the information provided will improve Outcome: Progressing

## 2023-08-19 NOTE — Plan of Care (Signed)
   Problem: Education: Goal: Knowledge of [facility] Education information/materials will improve Outcome: Progressing Goal: Emotional status will improve Outcome: Progressing Goal: Mental status will improve Outcome: Progressing Goal: Verbalization of understanding the information provided will improve Outcome: Progressing

## 2023-08-19 NOTE — Progress Notes (Signed)
 Adult Psychoeducational Group Note  Date:  08/19/2023 Time:  9:29 AM  Group Topic/Focus:  Goals Group:   The focus of this group is to help patients establish daily goals to achieve during treatment and discuss how the patient can incorporate goal setting into their daily lives to aide in recovery.  Participation Level:  Active  Participation Quality:  Appropriate  Affect:  Appropriate  Cognitive:  Appropriate  Insight: Appropriate  Engagement in Group:  Engaged  Modes of Intervention:  Discussion  Additional Comments:  Pt goal for the day is to talk to a doctor and Child psychotherapist.  Daine Pillar D 08/19/2023, 9:29 AM

## 2023-08-19 NOTE — Progress Notes (Signed)
   08/19/23 0900  Psych Admission Type (Psych Patients Only)  Admission Status Voluntary  Psychosocial Assessment  Patient Complaints Sadness  Eye Contact Fair  Facial Expression Sad  Affect Appropriate to circumstance  Speech Logical/coherent  Interaction Assertive  Motor Activity Slow  Appearance/Hygiene Unremarkable  Behavior Characteristics Cooperative  Mood Sad;Pleasant  Thought Process  Coherency WDL  Content WDL  Delusions None reported or observed  Perception WDL  Hallucination None reported or observed  Judgment WDL  Confusion None  Danger to Self  Current suicidal ideation? Denies  Description of Suicide Plan No Plan  Agreement Not to Harm Self Yes  Description of Agreement Verbal Contract  Danger to Others  Danger to Others None reported or observed

## 2023-08-19 NOTE — Progress Notes (Signed)
 [facility] MD Progress Note  08/19/2023 4:18 PM Lance Chapman  MRN:  [id number] Subjective:   58 year old African-American male, single, unemployed, on SSI disability, lives alone.  Background history of MDD recurrent, grief PTSD and multiple medical comorbidity presented voluntarily to urgent care on account of worsening depression.  Patient has been grieving the loss of his mother who passed in July last year.    Chart reviewed today.  Patient discussed with nursing.  No acute events occurred overnight.  Lance Chapman was seen in his room during rounds.  He reported that his mood was good today.  He denies any new psychiatric or medical complaints.  He reports that his appetite is good.  He reports that focus and concentration are adequate.  He denies issues with energy.  He reports adequate sleep.  He denies any medication side effects.  He denies suicidal ideations, homicidal ideations, auditory hallucinations, visual hallucinations, or delusions.  He continues to complain of anxiety.  He states that he is scared to leave the hospital for fear that he may decompensate again.  Supportive psychotherapy and reassurance were provided.  We discussed the options of intensive outpatient versus partial hospitalization if he feels like he needs extra support at discharge.  Will plan on checking labs tonight that were not checked this far, with likely discharge on Wednesday.   Principal Problem: MDD (major depressive disorder), recurrent severe, without psychosis (HCC) Diagnosis: Principal Problem:   MDD (major depressive disorder), recurrent severe, without psychosis (HCC)  Total Time spent with patient: 30 minutes  Past Psychiatric History:  Established history of MDD recurrent and PTSD.  Onset of mental illness was in his 62s.  He has been treated with fluoxetine  over the years.  States that he has not tried any other psychotropic medication as he did well on fluoxetine .  This is his first inpatient admission.   Single episode of slitting his wrists in the late 80s due to depression.  No other form of suicidal behavior.  No history of violent behavior.  No prior history of mania.  No prior history of psychosis. Patient is linked to a therapist in the community.  He also gets grief counseling through the hospice.  He is linked to his psychiatrist in the community.  He follows up regularly. No history of substance use.  No past history of addiction treatment. No past history of neuromodulation.  Past Medical History:  Past Medical History:  Diagnosis Date   Anxiety    Back pain, chronic    Depression    Hypertension    UTI (lower urinary tract infection)    History reviewed. No pertinent surgical history. Family History:  Family History  Problem Relation Age of Onset   Depression Mother    Dementia Mother    Family Psychiatric  History:  No family history of mental illness. No family history of addiction. No family history of suicide. No family history of sudden cardiac death.   Social History:  Social History   Substance and Sexual Activity  Alcohol Use No     Social History   Substance and Sexual Activity  Drug Use No    Social History   Socioeconomic History   Marital status: Single    Spouse name: Not on file   Number of children: Not on file   Years of education: Not on file   Highest education level: Not on file  Occupational History   Not on file  Tobacco Use   Smoking status: Never  Smokeless tobacco: Never  Substance and Sexual Activity   Alcohol use: No   Drug use: No   Sexual activity: Not on file  Other Topics Concern   Not on file  Social History Narrative   Not on file   Social Drivers of Health   Financial Resource Strain: Medium Risk (06/30/2023)   Received from [facility]   Overall Financial Resource Strain (CARDIA)    Difficulty of Paying Living Expenses: Somewhat hard  Food Insecurity: Food Insecurity Present (08/15/2023)   Hunger Vital Sign     Worried About Running Out of Food in the Last Year: Sometimes true    Ran Out of Food in the Last Year: Sometimes true  Transportation Needs: Unmet Transportation Needs (08/15/2023)   PRAPARE - Transportation    Lack of Transportation (Medical): Yes    Lack of Transportation (Non-Medical): Yes  Physical Activity: Inactive (06/30/2023)   Received from [facility]   Exercise Vital Sign    On average, how many days per week do you engage in moderate to strenuous exercise (like a brisk walk)?: 1 day    On average, how many minutes do you engage in exercise at this level?: 0 min  Stress: Stress Concern Present (06/30/2023)   Received from [facility] - Occupational Stress Questionnaire    Feeling of Stress : Very much  Social Connections: Socially Integrated (06/30/2023)   Received from [facility]   Social Network    How would you rate your social network (family, work, friends)?: Good participation with social networks   Additional Social History:    Current Medications: Current Facility-Administered Medications  Medication Dose Route Frequency Provider Last Rate Last Admin   acetaminophen  (TYLENOL ) tablet 1,000 mg  1,000 mg Oral Q6H PRN McCarty, Artie, MD       atorvastatin  (LIPITOR) tablet 10 mg  10 mg Oral Daily McCarty, Artie, MD   10 mg at 08/19/23 9178   empagliflozin  (JARDIANCE ) tablet 10 mg  10 mg Oral Daily McCarty, Artie, MD   10 mg at 08/19/23 9178   FLUoxetine  (PROZAC ) capsule 80 mg  80 mg Oral Daily McCarty, Artie, MD   80 mg at 08/19/23 9178   gabapentin  (NEURONTIN ) capsule 100 mg  100 mg Oral QHS Onuoha, Chinwendu V, NP   100 mg at 08/18/23 2105   lisinopril  (ZESTRIL ) tablet 20 mg  20 mg Oral Daily Mark Bard LABOR, RPH   20 mg at 08/19/23 9178   And   hydrochlorothiazide  (HYDRODIURIL ) tablet 12.5 mg  12.5 mg Oral Daily Mark Bard LABOR, RPH   12.5 mg at 08/19/23 9178   hydrOXYzine  (ATARAX ) tablet 10 mg  10 mg Oral TID PRN  McCarty, Artie, MD   10 mg at 08/16/23 2113   insulin  aspart (novoLOG ) injection 0-15 Units  0-15 Units Subcutaneous TID WC Onuoha, Chinwendu V, NP   3 Units at 08/19/23 1153   insulin  aspart (novoLOG ) injection 0-5 Units  0-5 Units Subcutaneous QHS Onuoha, Chinwendu V, NP   2 Units at 08/18/23 2106   mirtazapine  (REMERON ) tablet 15 mg  15 mg Oral QHS Izediuno, Vincent A, MD   15 mg at 08/18/23 2105   OLANZapine  (ZYPREXA ) injection 5 mg  5 mg Intramuscular TID PRN Onuoha, Chinwendu V, NP       OLANZapine  zydis (ZYPREXA ) disintegrating tablet 5 mg  5 mg Oral TID PRN Onuoha, Chinwendu V, NP       traZODone  (DESYREL ) tablet 50  mg  50 mg Oral QHS PRN McCarty, Artie, MD   50 mg at 08/16/23 2113    Lab Results:  Results for orders placed or performed during the hospital encounter of 08/15/23 (from the past 48 hours)  Glucose, capillary     Status: Abnormal   Collection Time: 08/17/23  5:02 PM  Result Value Ref Range   Glucose-Capillary 164 (H) 70 - 99 mg/dL    Comment: Glucose reference range applies only to samples taken after fasting for at least 8 hours.  Glucose, capillary     Status: Abnormal   Collection Time: 08/17/23  7:56 PM  Result Value Ref Range   Glucose-Capillary 168 (H) 70 - 99 mg/dL    Comment: Glucose reference range applies only to samples taken after fasting for at least 8 hours.  Glucose, capillary     Status: Abnormal   Collection Time: 08/18/23  6:34 AM  Result Value Ref Range   Glucose-Capillary 161 (H) 70 - 99 mg/dL    Comment: Glucose reference range applies only to samples taken after fasting for at least 8 hours.  Glucose, capillary     Status: Abnormal   Collection Time: 08/18/23 11:44 AM  Result Value Ref Range   Glucose-Capillary 167 (H) 70 - 99 mg/dL    Comment: Glucose reference range applies only to samples taken after fasting for at least 8 hours.  Glucose, capillary     Status: Abnormal   Collection Time: 08/18/23  5:02 PM  Result Value Ref Range    Glucose-Capillary 187 (H) 70 - 99 mg/dL    Comment: Glucose reference range applies only to samples taken after fasting for at least 8 hours.  Glucose, capillary     Status: Abnormal   Collection Time: 08/18/23  9:01 PM  Result Value Ref Range   Glucose-Capillary 221 (H) 70 - 99 mg/dL    Comment: Glucose reference range applies only to samples taken after fasting for at least 8 hours.  Glucose, capillary     Status: Abnormal   Collection Time: 08/19/23  6:18 AM  Result Value Ref Range   Glucose-Capillary 111 (H) 70 - 99 mg/dL    Comment: Glucose reference range applies only to samples taken after fasting for at least 8 hours.  Glucose, capillary     Status: Abnormal   Collection Time: 08/19/23 11:49 AM  Result Value Ref Range   Glucose-Capillary 191 (H) 70 - 99 mg/dL    Comment: Glucose reference range applies only to samples taken after fasting for at least 8 hours.    Blood Alcohol level:  Lab Results  Component Value Date   ETH <10 08/13/2021   ETH <10 05/14/2020    Metabolic Disorder Labs: Lab Results  Component Value Date   HGBA1C 6.4 (H) 08/14/2023   MPG 136.98 08/14/2023   MPG 182.9 11/17/2020   Lab Results  Component Value Date   PROLACTIN 8.6 11/17/2020   PROLACTIN QUANTITY NOT SUFFICIENT, UNABLE TO PERFORM TEST 05/13/2020   Lab Results  Component Value Date   CHOL 142 08/14/2023   TRIG 44 08/14/2023   HDL 65 08/14/2023   CHOLHDL 2.2 08/14/2023   VLDL 9 08/14/2023   LDLCALC 68 08/14/2023   LDLCALC 153 (H) 11/17/2020    Physical Findings: AIMS:  ,  ,  ,  ,  ,  ,   CIWA:    COWS:     Musculoskeletal: Strength & Muscle Tone: within normal limits Gait & Station: normal Patient  leans: N/A  Psychiatric Specialty Exam:  Presentation  General Appearance:  Casually dressed, not in any distress, appropriate behavior, good relatedness.  No EPS.  Eye Contact: Good.  Speech: Spontaneous.  Normal rate, tone and volume.  Normal prosody of speech.  Mood  and Affect  Mood: Euthymic.  Affect: Verbalize and affect appropriately.  Thought Process  Thought Processes: Linear and goal directed.  Descriptions of Associations:Intact  Orientation:Full (Time, Place and Person)  Thought Content: Future oriented.  No current suicidal thoughts.  No homicidal thoughts.  No thoughts of violence.  No negative ruminative flooding.  No guilty ruminations.  No delusional theme.  No obsessions.  Hallucinations: No hallucination in any modality.  Sensorium  Memory: Good.  Judgment: Good.  Insight: Good  Executive Functions  Concentration: Good.  Attention Span: Good.  Recall: Good.  Fund of Knowledge: Good.  Language: Good   Psychomotor Activity  Normal psychomotor activity    Physical Exam: Physical Exam ROS Blood pressure (!) 137/99, pulse 83, temperature 98.4 F (36.9 C), temperature source Oral, resp. rate 14, height 5' 9 (1.753 m), weight 79.4 kg, SpO2 100%. Body mass index is 25.84 kg/m.   Treatment Plan Summary: Patient presented with worsening depression in context of anniversary of parental losses.  We augmented fluoxetine  with mirtazapine  during this admission.  He is sleeping better and interacting appropriately.  No dangerousness.  No manic features.  No psychotic features.  Family concerns about his ability to live alone versus group home placement will be a decision between family and patient.  Will maintain his current regimen and evaluate him further.  Hopeful discharge in a couple of days.  1.  Fluoxetine  80 mg daily. 2.  Mirtazapine  15 mg at bedtime. 3.  Continue home medical medications at the same dose. 4.  Continue to encourage unit groups and therapeutic activities. 5.  Continue to monitor mood behavior and interaction with others. 6.  Social worker will coordinate discharge and aftercare planning.  Starleen GORMAN Kitty, MD 08/19/2023, 4:18 PM

## 2023-08-19 NOTE — Progress Notes (Signed)
 Shift Note  (Sleep Hours) -8.5  (Any PRNs that were needed, meds refused, or side effects to meds)- None  (Any disturbances and when (visitation, over night)-None  (Concerns raised by the patient)- None  (SI/HI/AVH)- Denies

## 2023-08-19 NOTE — [facility] Group Notes (Signed)
 The focus of this group is to help patients review their daily goal of treatment and discuss progress on daily workbooks. Pt was attentive and appropriate during tonight's wrap up group discussion. Pt shared that he was able to talk with brother today and had positive discussion. Pt stated was also able to talk with chaplin today. Pt would like to work on fresh and new start.

## 2023-08-20 DIAGNOSIS — F332 Major depressive disorder, recurrent severe without psychotic features: Secondary | ICD-10-CM | POA: Diagnosis not present

## 2023-08-20 LAB — HIV ANTIBODY (ROUTINE TESTING W REFLEX): HIV Screen 4th Generation wRfx: NONREACTIVE

## 2023-08-20 LAB — GLUCOSE, CAPILLARY
Glucose-Capillary: 136 mg/dL — ABNORMAL HIGH (ref 70–99)
Glucose-Capillary: 164 mg/dL — ABNORMAL HIGH (ref 70–99)
Glucose-Capillary: 175 mg/dL — ABNORMAL HIGH (ref 70–99)
Glucose-Capillary: 152 mg/dL — ABNORMAL HIGH (ref 70–99)

## 2023-08-20 LAB — RPR: RPR Ser Ql: NONREACTIVE

## 2023-08-20 MED ORDER — VITAMIN D (ERGOCALCIFEROL) 1.25 MG (50000 UNIT) PO CAPS
50000.0000 [IU] | ORAL_CAPSULE | Freq: Every day | ORAL | Status: DC
  Administered 2023-08-20: 50000 [IU] via ORAL
  Filled 2023-08-20 (×2): qty 1

## 2023-08-20 NOTE — Progress Notes (Signed)
(  Sleep Hours) -8.25 (Any PRNs that were needed, meds refused, or side effects to meds)- prn hydroxyzine  10mg  and trazodone  20mg  @ 2107 (Any disturbances and when (visitation, over night)-none (Concerns raised by the patient)- none (SI/HI/AVH)- Denies all

## 2023-08-20 NOTE — Progress Notes (Signed)
 D: Patient is alert, oriented, pleasant, and cooperative. Denies SI, HI, AVH, and verbally contracts for safety.    A: Scheduled medications administered per MD order. Support provided. Patient educated on safety on the unit and medications. Routine safety checks every 15 minutes. Patient stated understanding to tell nurse about any new physical symptoms. Patient understands to tell staff of any needs.     R: No adverse drug reactions noted. Patient remains safe at this time and will continue to monitor.    08/20/23 1300  Psych Admission Type (Psych Patients Only)  Admission Status Voluntary  Psychosocial Assessment  Patient Complaints Depression  Eye Contact Fair  Facial Expression Animated  Affect Appropriate to circumstance  Speech Logical/coherent  Interaction Assertive  Motor Activity Slow  Appearance/Hygiene Unremarkable  Behavior Characteristics Cooperative;Appropriate to situation  Mood Pleasant  Thought Process  Coherency WDL  Content WDL  Delusions None reported or observed  Perception WDL  Hallucination None reported or observed  Judgment WDL  Confusion None  Danger to Self  Current suicidal ideation? Denies  Agreement Not to Harm Self Yes  Description of Agreement verbal  Danger to Others  Danger to Others None reported or observed

## 2023-08-20 NOTE — Progress Notes (Signed)
 Group Topic/Focus:  Number of participants : 11 Medication compliance and side effect.  Discussed the importance of medication compliance in achieving the desired therapeutic effect by adhering to the required regimen.  Patients educated about some common side effects and were encouraged to reports to their provider any side effects.  Adequate food and fluid intake, sleep and support from family members are also helpful. Patient was attentive and receptive.

## 2023-08-20 NOTE — [facility] Group Notes (Signed)
 Adult Psychoeducational Group Note  Date:  08/20/2023 Time:  9:23 PM  Group Topic/Focus:  Wrap-Up Group:   The focus of this group is to help patients review their daily goal of treatment and discuss progress on daily workbooks.  Participation Level:  Active  Participation Quality:  Appropriate  Affect:  Appropriate  Cognitive:  Appropriate  Insight: Appropriate  Engagement in Group:  Engaged  Modes of Intervention:  Discussion  Additional Comments:  Braheem said the favorite part  his day is the end of the day.  Lang Drilling Long 08/20/2023, 9:23 PM

## 2023-08-20 NOTE — Group Note (Signed)
 Date:  08/20/2023 Time:  9:58 AM  Group Topic/Focus:  Goals Group:   The focus of this group is to help patients establish daily goals to achieve during treatment and discuss how the patient can incorporate goal setting into their daily lives to aide in recovery.    Participation Level:  Active  Participation Quality:  Appropriate  Affect:  Appropriate  Cognitive:  Appropriate  Insight: Appropriate  Engagement in Group:  Engaged  Modes of Intervention:  Discussion  Additional Comments:  Pt goal is to speak with social worker and doctor.  Lance Chapman Dawn 08/20/2023, 9:58 AM

## 2023-08-20 NOTE — Progress Notes (Signed)
 [facility] MD Progress Note  08/20/2023 1:29 PM Lance Chapman  MRN:  [id number]   Reason for admission:   58 year old African-American male, single, unemployed, on SSI disability, lives alone.  Background history of MDD recurrent, grief PTSD and multiple medical comorbidity presented voluntarily to urgent care on account of worsening depression.  Patient has been grieving the loss of his mother who passed in July last year.    Chart reviewed today.  Patient discussed with nursing.  No acute events occurred overnight.  Today's assessment notes: On assessment today, Channin was seen in his room during rounds.  He reports that his mood is euthymic, improved since admission, and stable. Denies feeling down, depressed, or sad.  No changes in his current treatment regimen.  Estimated date of discharge 08/21/23. Reports that anxiety symptoms are at manageable level.  Sleep is stable. Appetite is stable.  Concentration is without complaint.  Energy level is adequate. Denies having any suicidal thoughts. Denies having any suicidal intent and plan.  Denies having any HI.  Denies having psychotic symptoms.   Denies having side effects to current psychiatric medications.   Discussed discharge planning:  How to identify the signs of impending crisis, use of internal coping strategies, reaching out to friends and family that can help navigate a crisis, and a list of mental health professionals and agencies to call. Further to follow up on her mental health appointments and her PCP appointments.   Principal Problem: MDD (major depressive disorder), recurrent severe, without psychosis (HCC) Diagnosis: Principal Problem:   MDD (major depressive disorder), recurrent severe, without psychosis (HCC)  Total Time spent with patient: 45 minutes   Past Psychiatric History:  Established history of MDD recurrent and PTSD.  Onset of mental illness was in his 74s.  He has been treated with fluoxetine  over the years.   States that he has not tried any other psychotropic medication as he did well on fluoxetine .  This is his first inpatient admission.  Single episode of slitting his wrists in the late 80s due to depression.  No other form of suicidal behavior.  No history of violent behavior.  No prior history of mania.  No prior history of psychosis. Patient is linked to a therapist in the community.  He also gets grief counseling through the hospice.  He is linked to his psychiatrist in the community.  He follows up regularly. No history of substance use.  No past history of addiction treatment. No past history of neuromodulation.  Past Medical History:  Past Medical History:  Diagnosis Date   Anxiety    Back pain, chronic    Depression    Hypertension    UTI (lower urinary tract infection)    History reviewed. No pertinent surgical history. Family History:  Family History  Problem Relation Age of Onset   Depression Mother    Dementia Mother    Family Psychiatric  History:  No family history of mental illness. No family history of addiction. No family history of suicide. No family history of sudden cardiac death.   Social History:  Social History   Substance and Sexual Activity  Alcohol Use No     Social History   Substance and Sexual Activity  Drug Use No    Social History   Socioeconomic History   Marital status: Single    Spouse name: Not on file   Number of children: Not on file   Years of education: Not on file   Highest education level:  Not on file  Occupational History   Not on file  Tobacco Use   Smoking status: Never   Smokeless tobacco: Never  Substance and Sexual Activity   Alcohol use: No   Drug use: No   Sexual activity: Not on file  Other Topics Concern   Not on file  Social History Narrative   Not on file   Social Drivers of Health   Financial Resource Strain: Medium Risk (06/30/2023)   Received from [facility]   Overall Financial Resource Strain (CARDIA)     Difficulty of Paying Living Expenses: Somewhat hard  Food Insecurity: Food Insecurity Present (08/15/2023)   Hunger Vital Sign    Worried About Running Out of Food in the Last Year: Sometimes true    Ran Out of Food in the Last Year: Sometimes true  Transportation Needs: Unmet Transportation Needs (08/15/2023)   PRAPARE - Transportation    Lack of Transportation (Medical): Yes    Lack of Transportation (Non-Medical): Yes  Physical Activity: Inactive (06/30/2023)   Received from [facility]   Exercise Vital Sign    On average, how many days per week do you engage in moderate to strenuous exercise (like a brisk walk)?: 1 day    On average, how many minutes do you engage in exercise at this level?: 0 min  Stress: Stress Concern Present (06/30/2023)   Received from [facility] - Occupational Stress Questionnaire    Feeling of Stress : Very much  Social Connections: Socially Integrated (06/30/2023)   Received from [facility]   Social Network    How would you rate your social network (family, work, friends)?: Good participation with social networks   Additional Social History:   Current Medications: Current Facility-Administered Medications  Medication Dose Route Frequency Provider Last Rate Last Admin   acetaminophen  (TYLENOL ) tablet 1,000 mg  1,000 mg Oral Q6H PRN McCarty, Artie, MD       atorvastatin  (LIPITOR) tablet 10 mg  10 mg Oral Daily McCarty, Artie, MD   10 mg at 08/20/23 0848   empagliflozin  (JARDIANCE ) tablet 10 mg  10 mg Oral Daily McCarty, Artie, MD   10 mg at 08/20/23 0847   FLUoxetine  (PROZAC ) capsule 80 mg  80 mg Oral Daily McCarty, Artie, MD   80 mg at 08/20/23 0847   gabapentin  (NEURONTIN ) capsule 100 mg  100 mg Oral QHS Onuoha, Chinwendu V, NP   100 mg at 08/19/23 2107   lisinopril  (ZESTRIL ) tablet 20 mg  20 mg Oral Daily Mark Bard LABOR, RPH   20 mg at 08/20/23 0848   And   hydrochlorothiazide  (HYDRODIURIL ) tablet 12.5  mg  12.5 mg Oral Daily Mark Bard LABOR, RPH   12.5 mg at 08/20/23 0848   hydrOXYzine  (ATARAX ) tablet 10 mg  10 mg Oral TID PRN McCarty, Artie, MD   10 mg at 08/19/23 2107   insulin  aspart (novoLOG ) injection 0-15 Units  0-15 Units Subcutaneous TID WC Onuoha, Chinwendu V, NP   3 Units at 08/20/23 1206   insulin  aspart (novoLOG ) injection 0-5 Units  0-5 Units Subcutaneous QHS Onuoha, Chinwendu V, NP   2 Units at 08/18/23 2106   mirtazapine  (REMERON ) tablet 15 mg  15 mg Oral QHS Izediuno, Vincent A, MD   15 mg at 08/19/23 2107   OLANZapine  (ZYPREXA ) injection 5 mg  5 mg Intramuscular TID PRN Onuoha, Chinwendu V, NP       OLANZapine  zydis (ZYPREXA ) disintegrating tablet 5 mg  5 mg Oral TID PRN Onuoha, Chinwendu V, NP       traZODone  (DESYREL ) tablet 50 mg  50 mg Oral QHS PRN McCarty, Artie, MD   50 mg at 08/19/23 2107   Vitamin D  (Ergocalciferol ) (DRISDOL ) 1.25 MG (50000 UNIT) capsule 50,000 Units  50,000 Units Oral Daily Parker, Alvin S, MD   50,000 Units at 08/20/23 1009   Lab Results:  Results for orders placed or performed during the hospital encounter of 08/15/23 (from the past 48 hours)  Glucose, capillary     Status: Abnormal   Collection Time: 08/18/23  5:02 PM  Result Value Ref Range   Glucose-Capillary 187 (H) 70 - 99 mg/dL    Comment: Glucose reference range applies only to samples taken after fasting for at least 8 hours.  Glucose, capillary     Status: Abnormal   Collection Time: 08/18/23  9:01 PM  Result Value Ref Range   Glucose-Capillary 221 (H) 70 - 99 mg/dL    Comment: Glucose reference range applies only to samples taken after fasting for at least 8 hours.  Glucose, capillary     Status: Abnormal   Collection Time: 08/19/23  6:18 AM  Result Value Ref Range   Glucose-Capillary 111 (H) 70 - 99 mg/dL    Comment: Glucose reference range applies only to samples taken after fasting for at least 8 hours.  Glucose, capillary     Status: Abnormal   Collection Time: 08/19/23 11:49 AM   Result Value Ref Range   Glucose-Capillary 191 (H) 70 - 99 mg/dL    Comment: Glucose reference range applies only to samples taken after fasting for at least 8 hours.  Glucose, capillary     Status: Abnormal   Collection Time: 08/19/23  4:39 PM  Result Value Ref Range   Glucose-Capillary 191 (H) 70 - 99 mg/dL    Comment: Glucose reference range applies only to samples taken after fasting for at least 8 hours.  Folate     Status: None   Collection Time: 08/19/23  6:40 PM  Result Value Ref Range   Folate 12.6 >5.9 ng/mL    Comment: Performed at [facility], [address] [place], [place] [address]  RPR     Status: None   Collection Time: 08/19/23  6:40 PM  Result Value Ref Range   RPR Ser Ql NON REACTIVE NON REACTIVE    Comment: Performed at [facility], [address], [place], [place] [address]  Vitamin B12     Status: None   Collection Time: 08/19/23  6:40 PM  Result Value Ref Range   Vitamin B-12 380 180 - 914 pg/mL    Comment: (NOTE) This assay is not validated for testing neonatal or myeloproliferative syndrome specimens for Vitamin B12 levels. Performed at [facility], [address] [place], [place] [address]   VITAMIN D  25 Hydroxy (Vit-D Deficiency, Fractures)     Status: Abnormal   Collection Time: 08/19/23  6:40 PM  Result Value Ref Range   Vit D, 25-Hydroxy 26.83 (L) 30 - 100 ng/mL    Comment: (NOTE) Vitamin D  deficiency has been defined by the Institute of Medicine  and an Endocrine Society practice guideline as a level of serum 25-OH  vitamin D  less than 20 ng/mL (1,2). The Endocrine Society went on to  further define vitamin D  insufficiency as a level between 21 and 29  ng/mL (2).  1. IOM (Institute of Medicine). 2010. Dietary reference  intakes for  calcium  and D. [place]: [employer]. 2. Holick MF, Binkley [place], Bischoff-Ferrari HA, et al. Evaluation,  treatment, and prevention of vitamin  D deficiency: an Endocrine  Society clinical practice guideline, JCEM. 2011 Jul; 96(7): 1911-30.  Performed at [facility], [address], [place], [place] [address]   HIV Antibody (routine testing w rflx)     Status: None   Collection Time: 08/19/23  6:40 PM  Result Value Ref Range   HIV Screen 4th Generation wRfx Non Reactive Non Reactive    Comment: Performed at [facility], [address] [place], [place] [address]  Glucose, capillary     Status: Abnormal   Collection Time: 08/20/23  6:03 AM  Result Value Ref Range   Glucose-Capillary 136 (H) 70 - 99 mg/dL    Comment: Glucose reference range applies only to samples taken after fasting for at least 8 hours.  Glucose, capillary     Status: Abnormal   Collection Time: 08/20/23 11:54 AM  Result Value Ref Range   Glucose-Capillary 164 (H) 70 - 99 mg/dL    Comment: Glucose reference range applies only to samples taken after fasting for at least 8 hours.   Blood Alcohol level:  Lab Results  Component Value Date   ETH <10 08/13/2021   ETH <10 05/14/2020   Metabolic Disorder Labs: Lab Results  Component Value Date   HGBA1C 6.4 (H) 08/14/2023   MPG 136.98 08/14/2023   MPG 182.9 11/17/2020   Lab Results  Component Value Date   PROLACTIN 8.6 11/17/2020   PROLACTIN QUANTITY NOT SUFFICIENT, UNABLE TO PERFORM TEST 05/13/2020   Lab Results  Component Value Date   CHOL 142 08/14/2023   TRIG 44 08/14/2023   HDL 65 08/14/2023   CHOLHDL 2.2 08/14/2023   VLDL 9 08/14/2023   LDLCALC 68 08/14/2023   LDLCALC 153 (H) 11/17/2020   Physical Findings: AIMS:  ,  ,  ,  ,  ,  ,   CIWA:    COWS:     Musculoskeletal: Strength & Muscle Tone: within normal limits Gait & Station: normal Patient leans: N/A  Psychiatric Specialty Exam:  Presentation  General Appearance:  Casually dressed, not in any distress, appropriate behavior, good relatedness.  No EPS.  Eye Contact: Good.  Speech: Spontaneous.  Normal rate,  tone and volume.  Normal prosody of speech.  Mood and Affect  Mood: Euthymic.  Affect: Verbalize and affect appropriately.  Thought Process  Thought Processes: Linear and goal directed.  Descriptions of Associations:Intact  Orientation:Full (Time, Place and Person)  Thought Content: Future oriented.  No current suicidal thoughts.  No homicidal thoughts.  No thoughts of violence.  No negative ruminative flooding.  No guilty ruminations.  No delusional theme.  No obsessions.  Hallucinations: No hallucination in any modality.  Sensorium  Memory: Good.  Judgment: Good.  Insight: Good  Executive Functions  Concentration: Good.  Attention Span: Good.  Recall: Good.  Fund of Knowledge: Good.  Language: Good  Psychomotor Activity  Normal psychomotor activity  Physical Exam: Physical Exam Vitals and nursing note reviewed.  Constitutional:      General: He is not in acute distress.    Appearance: Normal appearance. He is normal weight. He is not ill-appearing.  HENT:     Head: Normocephalic.     Right Ear: External ear normal.     Nose: Nose normal.     Mouth/Throat:     Mouth: Mucous  membranes are moist.     Pharynx: Oropharynx is clear.  Cardiovascular:     Rate and Rhythm: Normal rate.     Pulses: Normal pulses.  Pulmonary:     Effort: Pulmonary effort is normal. No respiratory distress.  Abdominal:     Comments: Deferred  Genitourinary:    Comments: Deferred Musculoskeletal:        General: Normal range of motion.     Cervical back: Normal range of motion.  Skin:    General: Skin is warm.  Neurological:     General: No focal deficit present.     Mental Status: He is alert and oriented to person, place, and time.  Psychiatric:        Mood and Affect: Mood normal.        Behavior: Behavior normal.        Thought Content: Thought content normal.    Review of Systems  Constitutional:  Negative for chills and fever.  HENT:  Negative for  sore throat.   Eyes:  Negative for blurred vision.  Respiratory:  Negative for cough, sputum production, shortness of breath and wheezing.   Cardiovascular:  Negative for chest pain and palpitations.  Gastrointestinal:  Negative for abdominal pain, constipation, diarrhea, heartburn, nausea and vomiting.  Genitourinary:  Negative for dysuria.  Musculoskeletal:  Negative for falls.  Skin:  Negative for itching and rash.  Neurological:  Negative for dizziness and headaches.  Endo/Heme/Allergies:        See allergy listing  Psychiatric/Behavioral:  Positive for depression. Negative for hallucinations, substance abuse and suicidal ideas. The patient is nervous/anxious. The patient does not have insomnia.    Blood pressure 107/82, pulse 86, temperature 98 F (36.7 C), temperature source Oral, resp. rate 14, height 5' 9 (1.753 m), weight 79.4 kg, SpO2 100%. Body mass index is 25.84 kg/m.  Treatment Plan Summary: Patient presented with worsening depression in context of anniversary of parental losses.  We augmented fluoxetine  with mirtazapine  during this admission.  He is sleeping better and interacting appropriately.  No dangerousness.  No manic features.  No psychotic features.  Family concerns about his ability to live alone versus group home placement will be a decision between family and patient.  Will maintain his current regimen and evaluate him further.  Hopeful discharge in a couple of days.  1.  Fluoxetine  80 mg daily. 2.  Mirtazapine  15 mg at bedtime. 3.  Continue home medical medications at the same dose. 4.  Continue to encourage unit groups and therapeutic activities. 5.  Continue to monitor mood behavior and interaction with others. 6.  Social worker will coordinate discharge and aftercare planning.  Ellouise JAYSON Azure, FNP 08/20/2023, 1:29 PM Patient ID: Abran JINNY Lowers, male   DOB: [birth year], 58 y.o.   MRN: [id number]

## 2023-08-20 NOTE — Group Note (Signed)
 LCSW Group Therapy Note   Group Date: 08/20/2023 Start Time: 1100 End Time: 1200    Participation:  patient was present.  He listened and was respectful but didn't participate in the discussion.   Type of Therapy:  Group Therapy   Topic:  Speaking from the Heart: Communicating with Understanding and Empathy   Objective:  To help participants develop effective communication skills to express themselves clearly, listen actively, and navigate conflicts in a healthy way.   Goals: Increase awareness of verbal and non-verbal communication skills. Practice using "I" statements and active listening techniques. Learn coping strategies for managing communication stress.   Summary:  Participants explored the importance of communication, discussed challenges, and practiced skills such as active listening and assertive expression. They reflected on past experiences and identified ways to improve communication in their daily lives.   Therapeutic Modalities: Cognitive-Behavioral Therapy (CBT): Restructuring negative thought patterns in communication. Mindfulness: Staying present and calm during conversations. Psychoeducation: Learning about effective communication techniques.   Belmira Daley O Liley Rake, LCSWA 08/20/2023  12:43 PM

## 2023-08-20 NOTE — Progress Notes (Signed)
   08/19/23 1535  Spiritual Encounters  Type of Visit Initial  Care provided to: Patient  Referral source Chaplain assessment  Reason for visit Routine spiritual support  OnCall Visit No   Following spirituality group, I visited further with Mr. Tegh Franek one-on-one in open area outside dayroom.  Kyce debriefed with me around his spiritual beliefs and ways of meaning making from a [place] perspective. He shared some life stressors and hopes.  I provided compassionate, non-anxious presence and active listening. I facilitated meaning making and affirmed [place]'s faith outlook.  Shontez Sermon L. Fredrica, M.Div [phone]

## 2023-08-20 NOTE — [facility] Group Notes (Signed)
 Spirituality Group   Group Goal: Support / Education around grief and loss    Group Description: Following introductions and group rules, group members engaged in facilitated group dialog and support around topic of loss, with particular support around experiences of loss in their lives. Group members identified types of loss (relationships / self / things) as well as patterns, circumstances, and changes that precipitate loss. Reflection invited on thoughts / feelings around loss, normalized grief responses, and recognized variety in grief experience. Group noted Worden's four tasks of grief in discussion. Group drew on Adlerian / Rogerian, narrative, MI, with Yalom's group therapy as a primary framework.   Observations: The topic of grief and loss resonated with Lance Chapman and he shared openly with the group about multiple losses, engaged in meaning making with peers.  Cecil Vandyke L. Fredrica, M.Div [phone]

## 2023-08-20 NOTE — Plan of Care (Signed)

## 2023-08-21 DIAGNOSIS — F332 Major depressive disorder, recurrent severe without psychotic features: Secondary | ICD-10-CM

## 2023-08-21 LAB — GLUCOSE, CAPILLARY
Glucose-Capillary: 197 mg/dL — ABNORMAL HIGH (ref 70–99)
Glucose-Capillary: 157 mg/dL — ABNORMAL HIGH (ref 70–99)

## 2023-08-21 MED ORDER — VITAMIN D (ERGOCALCIFEROL) 1.25 MG (50000 UNIT) PO CAPS: 50000.0000 [IU] | ORAL_CAPSULE | ORAL | 0 refills | Status: AC

## 2023-08-21 MED ORDER — CHOLECALCIFEROL 125 MCG (5000 UT) PO TABS: 1.0000 | ORAL_TABLET | Freq: Every day | ORAL | 0 refills | Status: AC

## 2023-08-21 NOTE — Progress Notes (Signed)
 Upon discharging, Nurse went over AVS educating on medications and follow up appointments. Suicide safety plan was completed. Reviewed with nurse and copy given to patient and placed in chart. Belongings sheet was signed and items returned to patient. Patient denies SI/HI (with no plan) and AVH. Voices no concerns to staff prior discharging off unit. Was safely walked out ride.

## 2023-08-21 NOTE — BHH Suicide Risk Assessment (Signed)
 [facility] Discharge Suicide Risk Assessment   Principal Problem: MDD (major depressive disorder), recurrent severe, without psychosis (HCC)  Discharge Diagnoses: Principal Problem:   MDD (major depressive disorder), recurrent severe, without psychosis (HCC)      Total Time spent with patient: 40  Musculoskeletal: Strength & Muscle Tone: within normal limits Gait & Station: normal Patient leans: N/A   Psychiatric Specialty Exam:  Presentation  General Appearance: Appropriate for Environment  Eye Contact: Good  Speech: Clear and Coherent; Normal Rate  Speech Volume: Normal  Handedness: Right   Mood and Affect  Mood: Euthymic  Affect: Congruent   Thought Process  Thought Processes: Linear  Descriptions of Associations: Intact  Orientation: Full (Time, Place and Person)  Thought Content: Logical  History of Schizophrenia/Schizoaffective disorder: No  Duration of Psychotic Symptoms: NA Hallucinations: Hallucinations: None  Ideas of Reference: None  Suicidal Thoughts: Suicidal Thoughts: No  Homicidal Thoughts: Homicidal Thoughts: No   Sensorium  Memory: Immediate Good  Judgment: Fair  Insight: Fair   Art therapist  Concentration: Good  Attention Span: Good  Recall: Good  Fund of Knowledge: Good  Language: Good   Psychomotor Activity  Psychomotor Activity: Psychomotor Activity: Normal   Assets  Assets: Communication Skills; Social Support; Housing; Leisure Time   Sleep  Sleep: Sleep: Good Number of Hours of Sleep: 8.25   Physical Exam: General: Sitting comfortably. NAD. HEENT: Normocephalic, atraumatic, MMM, EMOI Lungs: no increased work of breathing noted Heart: no cyanosis Abdomen: Non distended Musculoskeletal: FROM. No obvious deformities Skin: Warm, dry, intact. No rashes noted Neuro: No obvious focal deficits.  Gait and station are normal  Review of Systems  Constitutional: Negative.   HENT: Negative.    Eyes:  Negative.   Respiratory: Negative.    Cardiovascular: Negative.   Gastrointestinal: Negative.   Genitourinary: Negative.   Skin: Negative.   Neurological: Negative.   Psychiatric/Behavioral:  Negative   Mental Status Per Nursing Assessment: NA  Demographic Factors:  Male, Divorced or widowed, Living alone, and Unemployed  Loss Factors: NA  Historical Factors: Family history of mental illness or substance abuse  Risk Reduction Factors:   Sense of responsibility to family, Religious beliefs about death, Positive social support, Positive therapeutic relationship, and Positive coping skills or problem solving skills  Continued Clinical Symptoms:  Previous psychiatric diagnoses and treatments  Cognitive Features That Contribute To Risk:  None  Suicide Risk:  Minimal: No identifiable suicidal ideation.  Patients presenting with no risk factors but with morbid ruminations; may be classified as minimal risk based on the severity of the depressive symptoms.    Follow-up Information     [facility]. Go on 08/26/2023.   Specialty: Behavioral Health Why: You have an appointment for medication management services on 08/26/23 at 2:30  pm.  You may also schedule an appointment for therapy services with this provider.  * PLEASE BE ADVISED THAT YOUR VOICEMAIL BOX IS FULL Contact information: [address] [place]  [address] [phone]        Center, [address] [facility]. Schedule an appointment as soon as possible for a visit.   Why: Please call this provider personally, to schedule an appointment for therapy services. Contact information: [address] [place], [place] [address] [phone]         [facility] Follow up.   Why: A referral has been sent on your behalf to [facility]. Please call Mrs. Barbaraann [phone] if you  have any questions surrounding  this program. Contact information: [address] [place], [place]  [phone]                 Plan Of Care/Follow-up recommendations:  Activity: as tolerated  Diet: heart healthy  Other: -Follow-up with your outpatient psychiatric provider -instructions on appointment date, time, and address (location) are provided to you in discharge paperwork.  -Take your psychiatric medications as prescribed at discharge - instructions are provided to you in the discharge paperwork  -Follow-up with outpatient primary care doctor and other specialists -for management of preventative medicine and chronic medical issues  -Testing: Follow-up with outpatient provider for any abnormal lab results (if any)  -If you are prescribed an atypical antipsychotic medication, we recommend that your outpatient psychiatrist follow routine screening for side effects within 3 months of discharge, including monitoring: AIMS scale, height, weight, blood pressure, fasting lipid panel, HbA1c, and fasting blood sugar.   -Recommend total abstinence from alcohol, tobacco, and other illicit drug use at discharge.   -If your psychiatric symptoms recur, worsen, or if you have side effects to your psychiatric medications, call your outpatient psychiatric provider, 911, [phone] or go to the nearest emergency department.  -If suicidal thoughts occur, immediately call your outpatient psychiatric provider, 911, [phone] or go to the nearest emergency department.   Glendia Kitty, MD 08/21/23 12:18 PM

## 2023-08-21 NOTE — Discharge Summary (Signed)
 Physician Discharge Summary Note  Patient:  Lance Chapman is a 58 y.o. male  MRN:  [id number]  DOB:  1965/02/07  Patient phone: [phone] (home)  Patient address:   [address] [place] [address]   Total Time spent with patient: 48 Minutes  Date of Admission:  08/15/2023  Date of Discharge: 08/21/23   Reason for Admission:  58 year old African-American male, single, unemployed, on SSI disability, lives alone.  Background history of MDD recurrent, grief PTSD and multiple medical comorbidity presented voluntarily to urgent care on account of worsening depression.  Patient has been grieving the loss of his mother who passed in July last year.  His father's anniversary is this months.   Patient does not have any family history of mental illness.  He has suffered from depression and PTSD for years.  He has always lived with his family.  States that he took care of his mother who had dementia.  His mother went to spend time with his other brother for 2 weeks when his mother passed.  Patient is not expressing any guilty ruminations.  States that this just been tough for him since his mother died.  He currently lives in the house alone.   Patient reports difficulty getting into sleep at night.  States that he gets about 4 hours of sleep at night.  His appetite has not been that great.  His physical and mental energy has not been that great.  States that he has been dwelling a lot on all the negative things that has happened in his life.  His father passed in August 1995.   Grief as his major stressor.  Patient comes from a big family.  All of his siblings have a Hotel manager background.  States that his siblings supportive.  No legal issues.  No tussle over his parents house.  The house is fully paid.  No interpersonal relational issues.     No associated suicidal thoughts.  No associated manic symptoms.  No associated psychotic symptoms.  Patient is not reporting any acute PTSD symptoms.   No use of alcohol or any psychoactive substance to cope.  No evidence of OCD.  Principal Problem: MDD (major depressive disorder), recurrent severe, without psychosis (HCC)  Discharge Diagnoses: Principal Problem:   MDD (major depressive disorder), recurrent severe, without psychosis (HCC)     Past Psychiatric (and medical) History: Lance Chapman  has a past medical history of Anxiety, Back pain, chronic, Depression, Hypertension, and UTI (lower urinary tract infection).   Past Medical History:  Past Medical History:  Diagnosis Date   Anxiety    Back pain, chronic    Depression    Hypertension    UTI (lower urinary tract infection)      History reviewed. No pertinent surgical history.   Family History:  Family History  Problem Relation Age of Onset   Depression Mother    Dementia Mother      Family Psychiatric  History: per H&P  Social History:  Social History   Substance and Sexual Activity  Alcohol Use No     Social History   Substance and Sexual Activity  Drug Use No     Social History   Socioeconomic History   Marital status: Single    Spouse name: Not on file   Number of children: Not on file   Years of education: Not on file   Highest education level: Not on file  Occupational History   Not on file  Tobacco Use   Smoking status: Never   Smokeless tobacco: Never  Substance and Sexual Activity   Alcohol use: No   Drug use: No   Sexual activity: Not on file  Other Topics Concern   Not on file  Social History Narrative   Not on file   Social Drivers of Health   Financial Resource Strain: Medium Risk (06/30/2023)   Received from [facility]   Overall Financial Resource Strain (CARDIA)    Difficulty of Paying Living Expenses: Somewhat hard  Food Insecurity: Food Insecurity Present (08/15/2023)   Hunger Vital Sign    Worried About Running Out of Food in the Last Year: Sometimes true    Ran Out of Food in the Last Year: Sometimes true   Transportation Needs: Unmet Transportation Needs (08/15/2023)   PRAPARE - Transportation    Lack of Transportation (Medical): Yes    Lack of Transportation (Non-Medical): Yes  Physical Activity: Inactive (06/30/2023)   Received from [facility]   Exercise Vital Sign    On average, how many days per week do you engage in moderate to strenuous exercise (like a brisk walk)?: 1 day    On average, how many minutes do you engage in exercise at this level?: 0 min  Stress: Stress Concern Present (06/30/2023)   Received from [facility] - Occupational Stress Questionnaire    Feeling of Stress : Very much  Social Connections: Socially Integrated (06/30/2023)   Received from [facility]   Social Network    How would you rate your social network (family, work, friends)?: Good participation with social networks     Hospital Course:  During the patient's hospitalization, patient had extensive initial psychiatric evaluation, and follow-up psychiatric evaluations every day.  Psychiatric diagnoses provided upon initial assessment: MDD (major depressive disorder), recurrent severe, without psychosis (HCC) [F33.2]   Patient's psychiatric medications were adjusted on admission: home meds continued  During the hospitalization, other adjustments were made to the patient's psychiatric medication regimen: Vitamin D  started for low level  Patient's care was discussed during the interdisciplinary team meeting every day during the hospitalization.  The patient denied having side effects to prescribed psychiatric medication.  Gradually, patient started adjusting to milieu. The patient was evaluated each day by a clinical provider to ascertain response to treatment. Improvement was noted by the patient's report of decreasing symptoms, improved sleep and appetite, affect, medication tolerance, behavior, and participation in unit programming.  Patient was asked each  day to complete a self inventory noting mood, mental status, pain, new symptoms, anxiety and concerns.    Symptoms were reported as significantly decreased or resolved completely by discharge.   On day of discharge, the patient reports that their mood is stable. The patient denied having suicidal thoughts for more than 48 hours prior to discharge.  Patient denies having homicidal thoughts.  Patient denies having auditory hallucinations.  Patient denies any visual hallucinations or other symptoms of psychosis. The patient was motivated to continue taking medication with a goal of continued improvement in mental health.   The patient reports their target psychiatric symptoms of depression responded well to the psychiatric medications and unit programming.  The patient reports overall benefit other psychiatric hospitalization. Supportive psychotherapy was provided to the patient. The patient also participated in regular group therapy while hospitalized. Coping skills, problem solving as well as relaxation therapies were also part of the unit programming.  Labs were reviewed with the patient, and abnormal  results were discussed with the patient.  The patient is able to verbalize their individual safety plan to this provider.    Physical Findings:  AIMS:  Facial and Oral Movements: None Muscles of Facial Expression: None Lips and Perioral Area: None Jaw: None Tongue: None,Extremity Movements Upper (arms, wrists, hands, fingers): None Lower (legs, knees, ankles, toes): None, Trunk Movements Neck, shoulders, hips: None, Global Judgements Severity of abnormal movements overall: None Incapacitation due to abnormal movements: None Patient's awareness of abnormal movements: No Awareness, Dental Status Current problems with teeth and/or dentures: No Does patient usually wear dentures: No Edentia: No   CIWA:   NA  COWS:  NA  Musculoskeletal: Strength & Muscle Tone: within normal limits Gait  & Station: normal Patient leans: N/A    Psychiatric Specialty Exam:  Presentation  General Appearance: Appropriate for Environment  Eye Contact: Good  Speech: Clear and Coherent; Normal Rate  Speech Volume: Normal  Handedness: Right   Mood and Affect  Mood: Euthymic  Affect: Congruent   Thought Process  Thought Processes: Linear  Descriptions of Associations: Intact  Orientation: Full (Time, Place and Person)  Thought Content: Logical  History of Schizophrenia/Schizoaffective disorder: No  Duration of Psychotic Symptoms: NA Hallucinations: Hallucinations: None  Ideas of Reference: None  Suicidal Thoughts: Suicidal Thoughts: No  Homicidal Thoughts: Homicidal Thoughts: No   Sensorium  Memory: Immediate Good  Judgment: Fair  Insight: Fair   Art therapist  Concentration: Good  Attention Span: Good  Recall: Good  Fund of Knowledge: Good  Language: Good   Psychomotor Activity  Psychomotor Activity: Psychomotor Activity: Normal   Assets  Assets: Communication Skills; Social Support; Housing; Leisure Time   Sleep  Sleep: Sleep: Good Number of Hours of Sleep: 8.25      Physical Exam: General: Sitting comfortably. NAD. HEENT: Normocephalic, atraumatic, MMM, EMOI Lungs: no increased work of breathing noted Heart: no cyanosis Abdomen: Non distended Musculoskeletal: FROM. No obvious deformities Skin: Warm, dry, intact. No rashes noted Neuro: No obvious focal deficits.  Gait and station are normal  Review of Systems:  Constitutional: Negative.   HENT: Negative.    Eyes: Negative.   Respiratory: Negative.    Cardiovascular: Negative.   Gastrointestinal: Negative.   Genitourinary: Negative.   Skin: Negative.   Neurological: Negative.   Psychiatric/Behavioral:  Negative  Blood pressure 101/76, pulse 86, temperature 98.4 F (36.9 C), temperature source Oral, resp. rate 16, height 5' 9 (1.753 m), weight 79.4 kg, SpO2 99%.  Body mass index is 25.84 kg/m.    Social History   Tobacco Use  Smoking Status Never  Smokeless Tobacco Never     Tobacco Cessation:  A prescription for an FDA approved medication for tobacco cessation was not prescribed because: Patient Refused   Blood Alcohol level:  Lab Results  Component Value Date   ETH <10 08/13/2021   ETH <10 05/14/2020    Metabolic Disorder Labs:  Lab Results  Component Value Date   HGBA1C 6.4 (H) 08/14/2023   MPG 136.98 08/14/2023   MPG 182.9 11/17/2020   Lab Results  Component Value Date   PROLACTIN 8.6 11/17/2020   PROLACTIN QUANTITY NOT SUFFICIENT, UNABLE TO PERFORM TEST 05/13/2020    Lab Results  Component Value Date   CHOL 142 08/14/2023   TRIG 44 08/14/2023   HDL 65 08/14/2023   VLDL 9 08/14/2023   LDLCALC 68 08/14/2023   LDLCALC 153 (H) 11/17/2020      See Psychiatric Specialty Exam and Suicide Risk Assessment  completed by Attending Physician prior to discharge.  Discharge destination:  home  Is patient on multiple antipsychotic therapies at discharge:  No  Has Patient had three or more failed trials of antipsychotic monotherapy by history: NA Recommended Plan for Multiple Antipsychotic Therapies: NA   Discharge Instructions     Diet - low sodium heart healthy   Complete by: As directed    Increase activity slowly   Complete by: As directed         Allergies as of 08/21/2023       Reactions   Bee Venom Anaphylaxis   Benadryl Allergy [diphenhydramine] Anaphylaxis        Medication List     TAKE these medications      Indication  acetaminophen  500 MG tablet Commonly known as: TYLENOL  Take 1,000 mg by mouth every 6 (six) hours as needed for headache, mild pain (pain score 1-3) or moderate pain (pain score 4-6).  Indication: Pain   atorvastatin  10 MG tablet Commonly known as: LIPITOR Take 10 mg by mouth daily.  Indication: High Amount of Fats in the Blood   Cholecalciferol  125 MCG (5000 UT)  Tabs Take 1 tablet (5,000 Units total) by mouth daily.  Indication: Vitamin D  Deficiency   EPINEPHrine 0.3 mg/0.3 mL Soaj injection Commonly known as: EPI-PEN Inject 0.3 mg into the muscle as needed for anaphylaxis.  Indication: Life-Threatening Hypersensitivity Reaction   FLUoxetine  40 MG capsule Commonly known as: PROZAC  Take 2 capsules (80 mg total) by mouth daily.  Indication: Generalized Anxiety Disorder, Major Depressive Disorder   hydrOXYzine  10 MG tablet Commonly known as: ATARAX  Take 1 tablet (10 mg total) by mouth 3 (three) times daily as needed.  Indication: Feeling Tense   Jardiance  10 MG Tabs tablet Generic drug: empagliflozin  Take 10 mg by mouth daily.  Indication: Type 2 Diabetes   lisinopril -hydrochlorothiazide  20-12.5 MG tablet Commonly known as: ZESTORETIC  Take 1 tablet by mouth daily.  Indication: High Blood Pressure   Ozempic (0.25 or 0.5 MG/DOSE) 2 MG/3ML Sopn Generic drug: Semaglutide(0.25 or 0.5MG /DOS) Inject 2.5 mLs into the skin once a week. On Fridays  Indication: Type 2 Diabetes   traZODone  50 MG tablet Commonly known as: DESYREL  Take 1 tablet (50 mg total) by mouth at bedtime as needed for sleep.  Indication: Trouble Sleeping   Tresiba  FlexTouch 100 UNIT/ML FlexTouch Pen Generic drug: insulin  degludec Inject 20 Units into the skin in the morning.  Indication: Type 2 Diabetes   valACYclovir  1000 MG tablet Commonly known as: VALTREX  Take 1 tablet (1,000 mg total) by mouth 3 (three) times daily. What changed:  when to take this reasons to take this  Indication: Herpes Simplex Infection   Vitamin D  (Ergocalciferol ) 1.25 MG (50000 UNIT) Caps capsule Commonly known as: DRISDOL  Take 1 capsule (50,000 Units total) by mouth every 7 (seven) days.  Indication: Vitamin D  Deficiency          Follow-up Information     [facility]. Go on 08/26/2023.   Specialty: Behavioral Health Why: You have an  appointment for medication management services on 08/26/23 at 2:30  pm.  You may also schedule an appointment for therapy services with this provider.  * PLEASE BE ADVISED THAT YOUR VOICEMAIL BOX IS FULL Contact information: [address] [place]  [address] [phone]        Center, [address] [facility]. Schedule an appointment as soon as possible for a visit.   Why: Please call  this provider personally, to schedule an appointment for therapy services. Contact information: [address] [place], [place] [address] [phone]         [facility] Follow up.   Why: A referral has been sent on your behalf to [facility]. Please call Mrs. Barbaraann [phone] if you have any questions surrounding this program. Contact information: [address] [place], [place]  [phone]                   Follow-up recommendations:  - It is recommended to the patient to continue psychiatric medications as prescribed, after discharge from the hospital.   - It is recommended to the patient to follow up with your outpatient psychiatric provider and PCP. - It was discussed with the patient, the impact of alcohol, drugs, tobacco have been there overall psychiatric and medical wellbeing, and total abstinence from substance use was recommended the patient. - Prescriptions provided or sent directly to preferred pharmacy at discharge. Patient agreeable to plan. Given opportunity to ask questions. Appears to feel comfortable with discharge.   - In the event of worsening symptoms, the patient is instructed to call the crisis hotline, 911 and or go to the nearest ED for appropriate evaluation and treatment of symptoms. To follow-up with primary care provider for other medical issues, concerns and or health care needs - Patient was discharged home with a plan to follow up as noted above.   Comments:  NA  Signed: Glendia Kitty, MD 08/21/23 12:23 PM

## 2023-08-21 NOTE — Progress Notes (Signed)
(  Sleep Hours) -8 (Any PRNs that were needed, meds refused, or side effects to meds)- Atarax  and trazodone  (Any disturbances and when (visitation, over night)-none (Concerns raised by the patient)- none (SI/HI/AVH)-denied

## 2023-08-21 NOTE — Plan of Care (Signed)
  Problem: Education: Goal: Knowledge of [facility] Education information/materials will improve Outcome: Progressing Goal: Emotional status will improve Outcome: Progressing Goal: Mental status will improve Outcome: Progressing Goal: Verbalization of understanding the information provided will improve Outcome: Progressing   Problem: Activity: Goal: Interest or engagement in activities will improve Outcome: Progressing Goal: Sleeping patterns will improve Outcome: Progressing   Problem: Coping: Goal: Ability to verbalize frustrations and anger appropriately will improve Outcome: Progressing Goal: Ability to demonstrate self-control will improve Outcome: Progressing   Problem: Health Behavior/Discharge Planning: Goal: Identification of resources available to assist in meeting health care needs will improve Outcome: Progressing Goal: Compliance with treatment plan for underlying cause of condition will improve Outcome: Progressing   Problem: Physical Regulation: Goal: Ability to maintain clinical measurements within normal limits will improve Outcome: Progressing   Problem: Safety: Goal: Periods of time without injury will increase Outcome: Progressing   Problem: Education: Goal: Ability to describe self-care measures that may prevent or decrease complications (Diabetes Survival Skills Education) will improve Outcome: Progressing Goal: Individualized Educational Video(s) Outcome: Progressing   Problem: Coping: Goal: Ability to adjust to condition or change in health will improve Outcome: Progressing   Problem: Fluid Volume: Goal: Ability to maintain a balanced intake and output will improve Outcome: Progressing   Problem: Health Behavior/Discharge Planning: Goal: Ability to identify and utilize available resources and services will improve Outcome: Progressing Goal: Ability to manage health-related needs will improve Outcome: Progressing   Problem:  Metabolic: Goal: Ability to maintain appropriate glucose levels will improve Outcome: Progressing   Problem: Nutritional: Goal: Maintenance of adequate nutrition will improve Outcome: Progressing Goal: Progress toward achieving an optimal weight will improve Outcome: Progressing   Problem: Skin Integrity: Goal: Risk for impaired skin integrity will decrease Outcome: Progressing   Problem: Tissue Perfusion: Goal: Adequacy of tissue perfusion will improve Outcome: Progressing

## 2023-08-21 NOTE — Progress Notes (Deleted)
 BH MD Outpatient Progress Note  08/21/2023 1:20 PM Lance Chapman  MRN:  [id number]  Assessment:  Lance Chapman presents for follow-up evaluation in-person. On initial visit, patient presented due to GAD, MDD, and caregiver burden that stemmed from aiding with mother who had suffered from Alzheimer's Disease and lack of companionship. Her mom had passed away 07/19/2022. He has recently been struggling with diabetic neuropathy and shingles.  He was recently admitted to the inpatient for severe depression in 08/2023  Today, 08/21/23, patient reports overall feeling that his symptoms of depression and anxiety have fluctuated but are manageable.  Recently, primary stressors have been handling diabetic neuropathy and shingles.  He denies any somatic complaints from fluoxetine .  He uses hydroxyzine  approximately twice a week then trazodone  sporadically to aid with sleep given ongoing pain secondary to shingles.  He does not regularly use the trazodone  and hydroxyzine  but does appreciate having them available as they have been previously effective for management of his insomnia and anxiety respectively.  Plan to continue psychotropic as prescribed.    Identifying Information: Lance Chapman is a 1. y.o. male with a history of GAD, MDD who is an established patient with [facility] for medication management.   Plan: # Generalized Anxiety Disorder Status of problem: partial remission Interventions: -- Continue fluoxetine  80 mg mg for depression and anxiety -- Continue trazodone  50 mg at bedtime prn for insomnia -- Continue hydroxyzine  10 mg tid prn for anxiety -- Refer for psychotherapy   # Major depressive Disorder-moderate Status of problem: partial remission Interventions: -- Continue prozac  and trazodone  as above -- Continue psychotherapy   #Grief -Grief counselor meeting with patient regularly  Patient was given contact information for behavioral health clinic and was  instructed to call 911 for emergencies.   Subjective:  Chief Complaint:  Medication Management  Interval History:  Patient reports past few months has been of this more difficult than before given ongoing medical problems including diabetic neuropathy in feet as well as struggling with pain secondary to shingles.  He reports his sleep had recently worsened because of the pain from laying on lesions around his left side. Trazodone  greatly helps with this. He reports overall his mood and anxiety has been manageable. He would like to continue psychotropics as prescribed given he has found his anxiety and depression are manageable at this time. He is currently in a relationship.   Visit Diagnosis:  No diagnosis found.       Past Medical History:  Past Medical History:  Diagnosis Date   Anxiety    Back pain, chronic    Depression    Hypertension    UTI (lower urinary tract infection)    No past surgical history on file.  Family History:  Family History  Problem Relation Age of Onset   Depression Mother    Dementia Mother     Social History:  Social History   Socioeconomic History   Marital status: Single    Spouse name: Not on file   Number of children: Not on file   Years of education: Not on file   Highest education level: Not on file  Occupational History   Not on file  Tobacco Use   Smoking status: Never   Smokeless tobacco: Never  Substance and Sexual Activity   Alcohol use: No   Drug use: No   Sexual activity: Not on file  Other Topics Concern   Not on file  Social History Narrative  Not on file   Social Drivers of Health   Financial Resource Strain: Medium Risk (06/30/2023)   Received from [facility]   Overall Financial Resource Strain (CARDIA)    Difficulty of Paying Living Expenses: Somewhat hard  Food Insecurity: Food Insecurity Present (08/15/2023)   Hunger Vital Sign    Worried About Running Out of Food in the Last Year: Sometimes true     Ran Out of Food in the Last Year: Sometimes true  Transportation Needs: Unmet Transportation Needs (08/15/2023)   PRAPARE - Administrator, Civil Service (Medical): Yes    Lack of Transportation (Non-Medical): Yes  Physical Activity: Inactive (06/30/2023)   [facility] from [facility]   Exercise Vital Sign    On average, how many days per week do you engage in moderate to strenuous exercise (like a brisk walk)?: 1 day    On average, how many minutes do you engage in exercise at this level?: 0 min  Stress: Stress Concern Present (06/30/2023)   Received from [facility] - Occupational Stress Questionnaire    Feeling of Stress : Very much  Social Connections: Socially Integrated (06/30/2023)   Received from [facility]   Social Network    How would you rate your social network (family, work, friends)?: Good participation with social networks    Allergies:  Allergies  Allergen Reactions   Bee Venom Anaphylaxis   Benadryl Allergy [Diphenhydramine] Anaphylaxis    ROS: All other ROS negative besides those noted in interval history  Objective: Psychiatric Specialty Exam: There were no vitals taken for this visit.There is no height or weight on file to calculate BMI. General Appearance: Well Groomed  Eye Contact:  Good  Speech:  Clear and Coherent and Normal Rate  Volume:  Normal  Mood:  Euthymic  Affect:  Appropriate and Congruent  Thought Process:  Coherent, Goal Directed, and Linear  Orientation:  Full (Time, Place, and Person)  Thought Content: Logical   Suicidal Thoughts:  No  Homicidal Thoughts:  No  Memory:  Remote;   Good  Judgment:  Fair  Insight:  Fair  Psychomotor Activity:  Normal  Concentration:  Concentration: Good and Attention Span: Good Assets:  Communication Skills Desire for Improvement Financial Resources/Insurance Housing Leisure Time Physical Health Resilience Social  Support Talents/Skills Transportation  ADL's:  Intact  Cognition: WNL  Sleep:  Good      PE: General: well-appearing; no acute distress  Pulm: no increased work of breathing on room air  Strength & Muscle Tone: within normal limits Neuro: no focal neurological deficits observed  Gait & Station: normal  Metabolic Disorder Labs: Lab Results  Component Value Date   HGBA1C 6.4 (H) 08/14/2023   MPG 136.98 08/14/2023   MPG 182.9 11/17/2020   Lab Results  Component Value Date   PROLACTIN 8.6 11/17/2020   PROLACTIN QUANTITY NOT SUFFICIENT, UNABLE TO PERFORM TEST 05/13/2020   Lab Results  Component Value Date   CHOL 142 08/14/2023   TRIG 44 08/14/2023   HDL 65 08/14/2023   CHOLHDL 2.2 08/14/2023   VLDL 9 08/14/2023   LDLCALC 68 08/14/2023   LDLCALC 153 (H) 11/17/2020   Lab Results  Component Value Date   TSH 1.665 08/14/2023   TSH 0.764 11/17/2020    Therapeutic Level Labs: No results found for: LITHIUM No results found for: VALPROATE No results found for: CBMZ  Screenings: AUDIT    Flowsheet Row [facility]  Alcohol Use Disorder Identification Test Final Score (AUDIT) 0   GAD-7    Flowsheet Row Clinical Support from 07/19/2021 in [facility] Counselor from 07/10/2021 in [facility] Clinical Support from 11/17/2020 in [facility] Counselor from 09/12/2020 in [facility] Clinical Support from 08/16/2020 in [facility]  Total GAD-7 Score 9 11 17 16 16    PHQ2-9    Flowsheet Row Clinical Support from 10/19/2021 in [facility] Clinical Support from 07/19/2021 in [facility] Counselor from 07/10/2021 in [facility] Clinical Support from 11/17/2020 in [facility] Counselor from 09/12/2020 in [facility]  PHQ-2 Total Score 5 5 2 6 4   PHQ-9 Total Score 15 11 11 21 19    Flowsheet Row Ad[facility] ([facility]) [facility] from 08/14/2023 in [facility] ED from 08/13/2021 in [facility] Emergency Department at [facility]  C-SSRS RISK CATEGORY No Risk No Risk No Risk     Jacqualin Sells, MD 08/21/2023, 1:20 PM

## 2023-08-21 NOTE — Progress Notes (Signed)
   08/21/23 0000  Psych Admission Type (Psych Patients Only)  Admission Status Voluntary  Psychosocial Assessment  Patient Complaints Depression;Anxiety  Eye Contact Fair  Facial Expression Anxious  Affect Appropriate to circumstance  Speech Logical/coherent  Interaction Assertive  Motor Activity Slow  Appearance/Hygiene Unremarkable  Behavior Characteristics Cooperative;Appropriate to situation  Mood Pleasant  Aggressive Behavior  Effect No apparent injury  Thought Process  Coherency WDL  Content WDL  Delusions None reported or observed  Perception WDL  Hallucination None reported or observed  Judgment WDL  Confusion None  Danger to Self  Current suicidal ideation? Denies

## 2023-08-21 NOTE — Group Note (Signed)
 Date:  08/21/2023 Time:  9:27 AM  Group Topic/Focus:  Goals Group:   The focus of this group is to help patients establish daily goals to achieve during treatment and discuss how the patient can incorporate goal setting into their daily lives to aide in recovery. Orientation:   The focus of this group is to educate the patient on the purpose and policies of crisis stabilization and provide a format to answer questions about their admission.  The group details unit policies and expectations of patients while admitted.    Participation Level:  Did Not Attend   Lance Chapman 08/21/2023, 9:27 AM

## 2023-08-21 NOTE — Progress Notes (Addendum)
  [facility] Adult Case Management Discharge Plan :  Will you be returning to the same living situation after discharge:  No.pt will be going to his brother's home temporarily, Lance Chapman brother [phone]  At discharge, do you have transportation home?: Yes,  pt will be transported by brother Do you have the ability to pay for your medications: Yes,  pt has active medical coverage  Release of information consent forms completed and in the chart;  Patient's signature needed at discharge.  Patient to Follow up at:  Follow-up Information     [facility] Go on 08/26/2023.   Specialty: Behavioral Health Why: You have an appointment for medication management services on 08/26/23 at 2:30  pm.  You may also schedule an appointment for therapy services with this provider.  * PLEASE BE ADVISED THAT YOUR VOICEMAIL BOX IS FULL Contact information: [address] [place]  [address] [phone]        [facility]. Schedule an appointment as soon as possible for a visit.   Why: Please call this provider personally, to schedule an appointment for therapy services. Contact information: [address] [place], [place] [address] [phone]         [facility] Follow up.   Why: A referral has been sent on your behalf to [facility]. Please call Mrs. Barbaraann [phone] if you have any questions surrounding this program. Contact information: [address] [place], [place]  [phone]                Next level of care provider has access to [facility] Link:no  Safety Planning and Suicide Prevention discussed: Yes,  Safety planning completed with brother, Lance Chapman, [phone]     Has patient been referred to the Quitline?: Patient does not use tobacco/nicotine products  Patient has been referred for addiction treatment: No known  substance use disorder.  Edward Guthmiller, Donia SAUNDERS, LCSWA 08/21/2023, 11:16 AM

## 2023-08-26 ENCOUNTER — Ambulatory Visit (HOSPITAL_COMMUNITY)

## 2023-08-26 ENCOUNTER — Telehealth (HOSPITAL_COMMUNITY): Payer: Self-pay

## 2023-08-26 ENCOUNTER — Encounter (HOSPITAL_COMMUNITY): Payer: Self-pay

## 2023-08-26 NOTE — Telephone Encounter (Signed)
 Patient had an appointment scheduled today at 2:30 PM.  Tried to call the patient, patient reported that he was not coming to the hospital as he had no means.  Reported that he would call the office to get his appointment rescheduled.   Anitra Doxtater
